# Patient Record
Sex: Male | Born: 1940 | Race: Black or African American | Hispanic: No | Marital: Married | Smoking: Former smoker
Health system: Southern US, Community
[De-identification: ages and names within clinical notes are randomized; demographics above are authoritative.]

## PROBLEM LIST (undated history)

## (undated) DIAGNOSIS — H409 Unspecified glaucoma: Secondary | ICD-10-CM

## (undated) DIAGNOSIS — I517 Cardiomegaly: Secondary | ICD-10-CM

## (undated) DIAGNOSIS — I2510 Atherosclerotic heart disease of native coronary artery without angina pectoris: Secondary | ICD-10-CM

## (undated) DIAGNOSIS — M1990 Unspecified osteoarthritis, unspecified site: Secondary | ICD-10-CM

## (undated) DIAGNOSIS — E785 Hyperlipidemia, unspecified: Secondary | ICD-10-CM

## (undated) DIAGNOSIS — K449 Diaphragmatic hernia without obstruction or gangrene: Secondary | ICD-10-CM

## (undated) DIAGNOSIS — K5792 Diverticulitis of intestine, part unspecified, without perforation or abscess without bleeding: Secondary | ICD-10-CM

## (undated) DIAGNOSIS — E119 Type 2 diabetes mellitus without complications: Secondary | ICD-10-CM

## (undated) DIAGNOSIS — G4730 Sleep apnea, unspecified: Secondary | ICD-10-CM

## (undated) DIAGNOSIS — K219 Gastro-esophageal reflux disease without esophagitis: Secondary | ICD-10-CM

## (undated) DIAGNOSIS — I10 Essential (primary) hypertension: Secondary | ICD-10-CM

## (undated) DIAGNOSIS — N400 Enlarged prostate without lower urinary tract symptoms: Secondary | ICD-10-CM

## (undated) DIAGNOSIS — A0472 Enterocolitis due to Clostridium difficile, not specified as recurrent: Secondary | ICD-10-CM

## (undated) HISTORY — DX: Unspecified osteoarthritis, unspecified site: M19.90

## (undated) HISTORY — DX: Benign prostatic hyperplasia without lower urinary tract symptoms: N40.0

## (undated) HISTORY — DX: Diaphragmatic hernia without obstruction or gangrene: K44.9

## (undated) HISTORY — PX: TRANSURETHRAL RESECTION OF PROSTATE: SHX73

## (undated) HISTORY — DX: Hyperlipidemia, unspecified: E78.5

## (undated) HISTORY — PX: ROTATOR CUFF REPAIR: SHX139

## (undated) HISTORY — DX: Type 2 diabetes mellitus without complications: E11.9

## (undated) HISTORY — DX: Atherosclerotic heart disease of native coronary artery without angina pectoris: I25.10

## (undated) HISTORY — PX: COLONOSCOPY: SHX174

## (undated) HISTORY — DX: Sleep apnea, unspecified: G47.30

## (undated) HISTORY — PX: CAROTID ENDARTERECTOMY: SUR193

## (undated) HISTORY — PX: STOMACH SURGERY: SHX791

## (undated) HISTORY — DX: Diverticulitis of intestine, part unspecified, without perforation or abscess without bleeding: K57.92

## (undated) HISTORY — DX: Unspecified glaucoma: H40.9

## (undated) HISTORY — PX: CARDIAC CATHETERIZATION: SHX172

## (undated) HISTORY — DX: Cardiomegaly: I51.7

## (undated) HISTORY — DX: Gastro-esophageal reflux disease without esophagitis: K21.9

## (undated) HISTORY — DX: Essential (primary) hypertension: I10

---

## 1998-07-14 ENCOUNTER — Emergency Department (HOSPITAL_COMMUNITY): Admission: EM | Admit: 1998-07-14 | Discharge: 1998-07-14 | Payer: Self-pay | Admitting: Emergency Medicine

## 2001-09-29 ENCOUNTER — Encounter: Payer: Self-pay | Admitting: Orthopedic Surgery

## 2001-09-29 ENCOUNTER — Ambulatory Visit (HOSPITAL_COMMUNITY): Admission: RE | Admit: 2001-09-29 | Discharge: 2001-09-29 | Payer: Self-pay | Admitting: Orthopedic Surgery

## 2001-10-13 ENCOUNTER — Ambulatory Visit (HOSPITAL_COMMUNITY): Admission: RE | Admit: 2001-10-13 | Discharge: 2001-10-13 | Payer: Self-pay | Admitting: Cardiology

## 2002-03-15 ENCOUNTER — Ambulatory Visit (HOSPITAL_COMMUNITY): Admission: RE | Admit: 2002-03-15 | Discharge: 2002-03-15 | Payer: Self-pay | Admitting: Family Medicine

## 2005-06-11 ENCOUNTER — Ambulatory Visit (HOSPITAL_COMMUNITY): Admission: RE | Admit: 2005-06-11 | Discharge: 2005-06-11 | Payer: Self-pay | Admitting: *Deleted

## 2005-08-04 ENCOUNTER — Ambulatory Visit (HOSPITAL_COMMUNITY): Admission: RE | Admit: 2005-08-04 | Discharge: 2005-08-04 | Payer: Self-pay | Admitting: Vascular Surgery

## 2005-08-10 ENCOUNTER — Ambulatory Visit (HOSPITAL_COMMUNITY): Admission: RE | Admit: 2005-08-10 | Discharge: 2005-08-11 | Payer: Self-pay | Admitting: Vascular Surgery

## 2005-08-10 ENCOUNTER — Encounter (INDEPENDENT_AMBULATORY_CARE_PROVIDER_SITE_OTHER): Payer: Self-pay | Admitting: *Deleted

## 2005-09-15 ENCOUNTER — Ambulatory Visit (HOSPITAL_COMMUNITY): Admission: RE | Admit: 2005-09-15 | Discharge: 2005-09-15 | Payer: Self-pay | Admitting: Cardiology

## 2007-02-25 ENCOUNTER — Ambulatory Visit: Payer: Self-pay | Admitting: Vascular Surgery

## 2008-09-05 ENCOUNTER — Emergency Department (HOSPITAL_COMMUNITY): Admission: EM | Admit: 2008-09-05 | Discharge: 2008-09-05 | Payer: Self-pay | Admitting: Emergency Medicine

## 2009-09-17 ENCOUNTER — Ambulatory Visit: Payer: Self-pay | Admitting: Gastroenterology

## 2009-09-17 DIAGNOSIS — K219 Gastro-esophageal reflux disease without esophagitis: Secondary | ICD-10-CM | POA: Insufficient documentation

## 2009-09-17 DIAGNOSIS — M129 Arthropathy, unspecified: Secondary | ICD-10-CM | POA: Insufficient documentation

## 2009-09-17 DIAGNOSIS — E119 Type 2 diabetes mellitus without complications: Secondary | ICD-10-CM | POA: Insufficient documentation

## 2009-09-17 DIAGNOSIS — I517 Cardiomegaly: Secondary | ICD-10-CM | POA: Insufficient documentation

## 2009-09-17 DIAGNOSIS — E7800 Pure hypercholesterolemia, unspecified: Secondary | ICD-10-CM | POA: Insufficient documentation

## 2009-09-17 DIAGNOSIS — I6529 Occlusion and stenosis of unspecified carotid artery: Secondary | ICD-10-CM | POA: Insufficient documentation

## 2009-09-17 DIAGNOSIS — R1013 Epigastric pain: Secondary | ICD-10-CM | POA: Insufficient documentation

## 2009-09-17 DIAGNOSIS — I2510 Atherosclerotic heart disease of native coronary artery without angina pectoris: Secondary | ICD-10-CM | POA: Insufficient documentation

## 2009-09-17 DIAGNOSIS — I10 Essential (primary) hypertension: Secondary | ICD-10-CM | POA: Insufficient documentation

## 2009-09-20 ENCOUNTER — Ambulatory Visit (HOSPITAL_COMMUNITY): Admission: RE | Admit: 2009-09-20 | Discharge: 2009-09-20 | Payer: Self-pay | Admitting: Gastroenterology

## 2009-09-30 ENCOUNTER — Ambulatory Visit: Payer: Self-pay | Admitting: Gastroenterology

## 2009-09-30 LAB — CONVERTED CEMR LAB: UREASE: NEGATIVE

## 2009-10-15 ENCOUNTER — Telehealth: Payer: Self-pay | Admitting: Gastroenterology

## 2009-10-28 ENCOUNTER — Telehealth: Payer: Self-pay | Admitting: Gastroenterology

## 2009-12-11 ENCOUNTER — Telehealth: Payer: Self-pay | Admitting: Gastroenterology

## 2010-11-18 NOTE — Miscellaneous (Signed)
Summary: clotest  Clinical Lists Changes  Orders: Added new Test order of TLB-H Pylori Screen Gastric Biopsy (83013-CLOTEST) - Signed 

## 2010-11-18 NOTE — Progress Notes (Signed)
Summary: EGD results wanted  Phone Note Call from Patient Call back at Home Phone [phone] Call back at [phone]   Caller: Patient Call For: Dr. Jarold Motto Reason for Call: Talk to Nurse Summary of Call: pt would like his EGD results Initial call taken by: Vallarie Mare,  October 15, 2009 10:50 AM  Follow-up for Phone Call        Results of EGD discussed with pt.  Pt states Nexium is causing stomach to hurt more.  Omeperazole did the same thing.  Asking to try another reflux med.  Insurance would not cover generic Protonix.  Pt asks if he can be given samples to try before having to pay for Rx that he may not be able to tolerate.   Follow-up by: Ashok Cordia RN,  October 15, 2009 12:19 PM  Additional Follow-up for Phone Call Additional follow up Details #1::        Pt given samples of aciphex to try for a few weeks.  Pt will report back. Additional Follow-up by: Ashok Cordia RN,  October 16, 2009 10:07 AM    New/Updated Medications: ACIPHEX 20 MG  TBEC (RABEPRAZOLE SODIUM) Take 1 each day 30 minutes before meals

## 2010-11-18 NOTE — Letter (Signed)
Summary: EGD Instructions  [facility]  [address] [place], [place] [address]   Phone: [phone]  Fax: [phone]       Frederick Cisneros    June 05, 1941    MRN: [id number]       Procedure Day /Date: Monday, 12/13/10_     Arrival Time: 8;30     Procedure Time:\9:30_     Location of Procedure:                    _x  [name] (4th Floor) _  _ [facility] ( Outpatient Registration) _  _ [facility] (Short Stay on [address]   PREPARATION FOR ENDOSCOPY   On MONDAY, 09/30/09 THE DAY OF THE PROCEDURE:  1.   No solid foods, milk or milk products are allowed after midnight the night before your procedure.  2.   Do not drink anything colored red or purple.  Avoid juices with pulp.  No orange juice.  3.  You may drink clear liquids until 7:30 am, which is 2 hours before your procedure.                                                                                                CLEAR LIQUIDS INCLUDE: Water Jello Ice Popsicles Tea (sugar ok, no milk/cream) Powdered fruit flavored drinks Coffee (sugar ok, no milk/cream) Gatorade Juice: apple, white grape, white cranberry  Lemonade Clear bullion, consomm, broth Carbonated beverages (any kind) Strained chicken noodle soup Hard Candy   MEDICATION INSTRUCTIONS  Unless otherwise instructed, you should take regular prescription medications with a small sip of water as early as possible the morning of your procedure.  Diabetic patients - see separate instructions.                      OTHER INSTRUCTIONS  You will need a responsible adult at least 70 years of age to accompany you and drive you home.   This person must remain in the waiting room during your procedure.  Wear loose fitting clothing that is easily removed.  Leave jewelry and other valuables at home.  However, you may wish to bring a book to read or an iPod/MP3 player to listen to music as you  wait for your procedure to start.  Remove all body piercing jewelry and leave at home.  Total time from sign-in until discharge is approximately 2-3 hours.  You should go home directly after your procedure and rest.  You can resume normal activities the day after your procedure.  The day of your procedure you should not:   Drive   Make legal decisions   Operate machinery   Drink alcohol   Return to work  You will receive specific instructions about eating, activities and medications before you leave.    The above instructions have been reviewed and explained to me by   _______________________    I fully understand and can verbalize these instructions _____________________________ Date _________

## 2010-11-18 NOTE — Progress Notes (Signed)
Summary: Samples worked well  Phone Note Call from Patient Call back at [employer] [phone]   Call For: Dr Jarold Motto Reason for Call: Talk to Nurse Summary of Call: Question about his medications. Got samples and they seem to be working well but thinks his insurance will not cover. Initial call taken by: Leanor Kail [facility],  October 28, 2009 11:23 AM  Follow-up for Phone Call        Aciphex is working well without any side effects.  [facility]  Not sure of insurance coverage for this.  Will send rx and if not covered will try to get prior aurth. Follow-up by: Ashok Cordia RN,  October 28, 2009 11:36 AM    Prescriptions: ACIPHEX 20 MG  TBEC (RABEPRAZOLE SODIUM) Take 1 each day 30 minutes before meals  #30 x 6   Entered by:   Ashok Cordia RN   Authorized by:   Mardella Layman MD [facility]   Signed by:   Ashok Cordia RN on 10/28/2009   Method used:   [facility]  [address] [phone]* (retail)       [address]       [place], [place]  [id number]       Ph: [phone] or [phone]       Fax: [phone]   RxID:   [id number]

## 2010-11-18 NOTE — Assessment & Plan Note (Signed)
Summary: persistent gerd....em,   History of Present Illness Visit Type: Initial Consult Primary GI MD: Sheryn Bison MD FACP FAGA Primary Provider: Holley Bouche, MD Requesting Provider: Holley Bouche, MD Chief Complaint: Patient referred for persistant reflux and midepigastric pain. Patient complains of of a bad taste in his mouth and being woke at 3am by the pain in his mid abdomin. He feels like his reflux has been getting worse in the last two years and his PPI is not working to contol his reflux.  History of Present Illness:   This patient is a 70 year old African American Medicare patient referred by Dr. Tiburcio Pea for history of daily epigastric pain of a burning nature with regurgitation and acid reflux symptoms but no dysphagia. He freely has nocturnal awakening with epigastric pain but does not use antacids because they" make me hallucinate." He's been on Prilosec for the last year but takes daily aspirin and frequent NSAIDs. He allegedly had an endoscopy many years ago but we cannot find this report in the records. He allegedly had a colonoscopy by Dr. Loreta Ave in 2008. He currently denies lower gastrointestinal or hepatobiliary complaints. He denies a specific food intolerances, anorexia weight loss, alcohol or cigarette abuse. Family history noncontributory.  Records from Dr. Tiburcio Pea are reviewed including normal liver function tests. He does suffer from nonobstructive coronary artery disease, peripheral vascular disease, hypertension, and hyperlipidemia and is on multiple medications are also reviewed.   GI Review of Systems    Reports abdominal pain, belching, chest pain, and  heartburn.     Location of  Abdominal pain: epigastric area.    Denies acid reflux, bloating, dysphagia with liquids, dysphagia with solids, loss of appetite, nausea, vomiting, vomiting blood, weight loss, and  weight gain.        Denies anal fissure, black tarry stools, change in bowel habit, constipation,  diarrhea, diverticulosis, fecal incontinence, heme positive stool, hemorrhoids, irritable bowel syndrome, jaundice, light color stool, liver problems, rectal bleeding, and  rectal pain. Preventive Screening-Counseling & Management  Alcohol-Tobacco     Smoking Status: quit      Drug Use:  no.      Current Medications (verified): 1)  Omeprazole 20 Mg  Cpdr (Omeprazole) .Marland Kitchen.. 1 Each Day 30 Minutes Before Meal 2)  Metoprolol Tartrate 100 Mg Tabs (Metoprolol Tartrate) .... Take One By Mouth Once Daily 3)  Amlodipine Besylate 5 Mg Tabs (Amlodipine Besylate) .Marland Kitchen.. 1 1/2 Tablet By Mouth Once Daily 4)  Triamterene-Hctz 37.5-25 Mg Caps (Triamterene-Hctz) .Marland Kitchen.. 1 By Mouth Every Morning 5)  Crestor 20 Mg Tabs (Rosuvastatin Calcium) .... Take One By Mouth Once Daily 6)  Welchol 625 Mg Tabs (Colesevelam Hcl) .... Take One By Mouth Two Times A Day 7)  Ibuprofen 800 Mg Tabs (Ibuprofen) .... Take One By Mouth As Needed 8)  Aspirin 325 Mg Tabs (Aspirin) .Marland Kitchen.. 1 By Mouth Once Daily 9)  Aleve 220 Mg Tabs (Naproxen Sodium) .... Take One By Mouth As Needed 10)  Centrum Silver  Tabs (Multiple Vitamins-Minerals) .... Take One By Mouth Once Daily  Allergies (verified): No Known Drug Allergies  Past History:  Past medical, surgical, family and social histories (including risk factors) reviewed for relevance to current acute and chronic problems.  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 Family History  of Prostate Cancer: maternal uncle No FH of Colon Cancer: Family History of Diabetes: 4 out of 5 sister  Family History of Breast Cancer: sister  Social  History: Reviewed history and no changes required. Occupation: retired married two sons and two daughters Patient is a former smoker.  Alcohol Use - no Daily Caffeine Use 2 per day Illicit Drug Use - no Smoking Status:  quit Drug Use:  no  Review of Systems       The patient complains of arthritis/joint pain, headaches-new, night sweats, and sleeping problems.  The patient denies allergy/sinus, anemia, anxiety-new, back pain, blood in urine, breast changes/lumps, change in vision, confusion, cough, coughing up blood, depression-new, fainting, fatigue, fever, hearing problems, heart murmur, heart rhythm changes, itching, menstrual pain, muscle pains/cramps, nosebleeds, pregnancy symptoms, shortness of breath, skin rash, sore throat, swelling of feet/legs, swollen lymph glands, thirst - excessive , urination - excessive , urination changes/pain, urine leakage, vision changes, and voice change.    Vital Signs:  Patient profile:   70 year old male Height:      65.5 inches Weight:      169.8 pounds BMI:     27.93 Pulse rate:   68 / minute Pulse rhythm:   regular BP sitting:   144 / 68  (left arm) Cuff size:   regular  Vitals Entered By: Harlow Mares CMA (AAMA) (September 17, 2009 8:40 AM)  Physical Exam  General:  Well developed, well nourished, no acute distress.healthy appearing.   Head:  Normocephalic and atraumatic. Eyes:  PERRLA, no icterus.exam deferred to patient's ophthalmologist.   Neck:  Supple; no masses or thyromegaly. Lungs:  Clear throughout to auscultation. Heart:  Regular rate and rhythm; no murmurs, rubs,  or bruits. Abdomen:  Soft, nontender and nondistended. No masses, hepatosplenomegaly or hernias noted. Normal bowel sounds. Msk:  Symmetrical with no gross deformities. Normal posture. Extremities:  No clubbing, cyanosis, edema or deformities noted. Neurologic:  Alert and  oriented x4;  grossly normal neurologically. Cervical Nodes:  No significant cervical  adenopathy. Axillary Nodes:  No significant axillary adenopathy. Inguinal Nodes:  No significant inguinal adenopathy. Psych:  Alert and cooperative. Normal mood and affect.   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 I changed him to Nexium 40 mg before supper and scheduled endoscopic exam. We will try to obtain previous GI records for review from Dr. Loreta Ave. Also schedule ultrasound exam to exclude cholelithiasis.  Problem # 2:  ARTHRITIS (ICD-716.90) Assessment: Unchanged Consider switching to a Cox 2 inhibitor which would cause less gastrointestinal mucosal damage.  Problem # 3:  HYPERTENSION (ICD-401.9) Assessment: Improved blood pressure today is 144/68 and advised to continue all his other medications per Dr. Tiburcio Pea  Patient Instructions: 1)  Copy sent to : Dr. Johny Blamer 2)  Please continue current medications.  3)  Avoid foods high in acid content ( tomatoes, citrus juices, spicy foods) . Avoid eating within 3 to 4 hours of lying down or before exercising. Do not over eat; try smaller more frequent meals. Elevate head of bed four inches when sleeping.  4)  Conscious Sedation brochure given.  5)  Upper Endoscopy brochure given.   Appended Document: persistent gerd....em,    Clinical Lists Changes  Medications: Changed medication from OMEPRAZOLE 20 MG  CPDR (OMEPRAZOLE) 1 each day 30 minutes before meal to NEXIUM 40 MG CPDR (  ESOMEPRAZOLE MAGNESIUM) 1 by mouth before supper - Signed Rx of NEXIUM 40 MG CPDR (ESOMEPRAZOLE MAGNESIUM) 1 by mouth before supper;  #30 x 6;  Signed;  Entered by: Ashok Cordia RN;  Authorized by: Mardella Layman MD [facility];  [facility] [phone]*, [address], [place], [place], [place]  [id number], Ph:  [phone] or [phone], Fax: [phone] Orders: Added new Test order of EGD (EGD) - Signed Added new Test order of Ultrasound Abdomen (UAS) - Signed    Prescriptions: NEXIUM 40 MG CPDR (ESOMEPRAZOLE MAGNESIUM) 1 by mouth before supper  #30 x 6   Entered by:   Ashok Cordia RN   Authorized by:   Mardella Layman MD [facility]   Signed by:   Ashok Cordia RN on 09/17/2009   Method used:   [facility]        [facility]  [address] [phone]* (retail)       [address]       [place], [place]  [id number]       Ph: [phone] or [phone]       Fax: [phone]   RxID:   [id number]

## 2010-11-18 NOTE — Progress Notes (Signed)
Summary: med change  Phone Note Call from Patient Call back at Home Phone [phone]   Caller: Patient Call For: Dr. Jarold Motto Reason for Call: Talk to Nurse Summary of Call: pt would like to be rx'ed something cheaper than Aciphex... pt suggested generic [facility]ix... [facility] [address] [facility] [place] Initial call taken by: Vallarie Mare,  December 11, 2009 9:33 AM  Follow-up for Phone Call        OK to try generic protonix.  Rx sent.  Pt notified.  Follow-up by: Ashok Cordia RN,  December 11, 2009 9:46 AM    New/Updated Medications: PANTOPRAZOLE SODIUM 40 MG TBEC (PANTOPRAZOLE SODIUM) 1 by mouth qd Prescriptions: PANTOPRAZOLE SODIUM 40 MG TBEC (PANTOPRAZOLE SODIUM) 1 by mouth qd  #30 x 11   Entered by:   Ashok Cordia RN   Authorized by:   Mardella Layman MD [facility]   Signed by:   Ashok Cordia RN on 12/11/2009   Method used:   [facility]        [facility]  [address] [phone]* (retail)       [address]       [place], [place]  [id number]       Ph: [phone] or [phone]       Fax: [phone]   RxID:   [id number]

## 2010-11-18 NOTE — Procedures (Signed)
Summary: Upper Endoscopy  Patient: Frederick Cisneros Note: All result statuses are Final unless otherwise noted.  Tests: (1) Upper Endoscopy (EGD)   EGD Upper Endoscopy       DONE     [facility]     [address]     [place], [place]  [address]           ENDOSCOPY PROCEDURE REPORT           PATIENT:  [name], [name]  MR#:  [id number]     BIRTHDATE:  12-26-1940, 68 yrs. old  GENDER:  male           ENDOSCOPIST:  Vania Rea. Jarold Motto, MD, [facility]     Referred by:           PROCEDURE DATE:  09/30/2009     PROCEDURE:  EGD with biopsy     ASA CLASS:  Class II     INDICATIONS:  abdominal pain despite treatment           MEDICATIONS:   Fentanyl 25 mcg IV, Versed 3 mg IV     TOPICAL ANESTHETIC:  Exactacain Spray           DESCRIPTION OF PROCEDURE:   After the risks benefits and     alternatives of the procedure were thoroughly explained, informed     consent was obtained.  The LB [id number]-[id number] endoscope was     introduced through the mouth and advanced to the second portion of     the duodenum, without limitations.  The instrument was slowly     withdrawn as the mucosa was fully examined.     <<PROCEDUREIMAGES>>           A hiatal hernia was found. SMALL 2 CM.HH NOTED  Normal duodenal     folds were noted.  The esophagus and gastroesophageal junction     were completely normal in appearance.  The stomach was entered and     closely examined. The antrum, angularis, and lesser curvature were     well visualized, including a retroflexed view of the cardia and     fundus. The stomach wall was normally distensable. The scope     passed easily through the pylorus into the duodenum. CLO BX. DONE.     Retroflexed views revealed no abnormalities.    The scope was then     withdrawn from the patient and the procedure completed.           COMPLICATIONS:  None           ENDOSCOPIC IMPRESSION:     1) Hiatal hernia     2) Normal duodenal folds     3) Normal esophagus     4) Normal  stomach     CHRONIC GERD     RECOMMENDATIONS:     1) anti-reflux regimen to be follow     2) continue PPI     3) Rx CLO if positive           REPEAT EXAM:  No           ______________________________     Vania Rea. Jarold Motto, MD, [facility] Graham           CC:  Johny Blamer MD           n.     Rosalie Doctor:   Vania Rea. Patterson at 09/30/2009 09:46 AM           Audry Riles, [id number]  Note: An exclamation mark (!) indicates a result that was not dispersed into the flowsheet. Document Creation Date: 09/30/2009 9:46 AM _______________________________________________________________________  (1) Order result status: Final Collection or observation date-time: 09/30/2009 09:40 Requested date-time:  Receipt date-time:  Reported date-time:  Referring Physician:   Ordering Physician: Sheryn Bison [phone]) Specimen Source:  Source: Launa Grill Order Number: [id number] Lab site:

## 2011-03-03 NOTE — Consult Note (Signed)
NAME:  Frederick Cisneros, Frederick Cisneros NO.:  [id number]   MEDICAL RECORD NO.:  [id number]          PATIENT TYPE:  EMS   LOCATION:  MAJO                         FACILITY:  [facility]   PHYSICIAN:  Jake Bathe, MD      DATE OF BIRTH:  1941-05-10   DATE OF CONSULTATION:  DATE OF DISCHARGE:  09/05/2008                                 CONSULTATION   REFERRING PHYSICIAN:  Rhae Lerner. Margretta Ditty, MD, [facility] Emergency  [facility].   CARDIOLOGIST:  Armanda Magic, MD   PRIMARY CARE PHYSICIAN:  Holley Bouche, MD   REASON FOR CONSULTATION:  Frederick Cisneros is being seen at the request of Dr.  Margretta Ditty for the evaluation of chest discomfort and abnormal EKG.   HISTORY OF PRESENT ILLNESS:  Frederick Cisneros is a 70 year old patient with a 1-  week history of reflux  says that he has a burning from his stomach up  to his throat into his mouth and always occurs at night.  This has been  intensifying over the past few days.  Sometimes he does have a funny  taste in his mouth.   He says that Zantac is not helping him.  He did  have a visit over at the [facility] where a GI  cocktail was given and it did help him.  He still had some subtle  burning and was given a sublingual nitroglycerin and morphine, which  completely relieved his symptoms.  We were called to evaluate not only  for his chest discomfort but also his markedly abnormal EKG.  Dr. Mayford Knife  in 2002 had done a preoperative workup on him, which involved a cardiac  catheterization.  The cardiac catheterization at that time showed  nonobstructive coronary artery disease with 40% narrowing of the LAD  distal to the first diagonal branch.  Otherwise, all of the other  arteries were widely patent.  He did have an aortic pressure of 175/89  as well as an LV pressure of 173/11 and was noted to have moderate left  ventricular hypertrophy, which most likely accounted for his markedly  abnormal T-wave inversions on EKG.  Currently, he is  comfortable in his  bed, not complaining of any symptoms with his wife at bedside.   PAST MEDICAL HISTORY:  1. Nonobstructive CAD as described above.  2. Status post left shoulder surgery.  3. Left carotid endarterectomy in October 2006.  4. Hypercholesterolemia.  5. Hypertension.  6. Moderate left ventricular hypertrophy.  7. GERD.   ALLERGIES:  No known drug allergies.   MEDICATIONS:  1. Norvasc 10 mg once a day.  2. Aspirin 325 mg once a day.  3. Zantac at home.  4. Toprol-XL 25 mg a day.  5. Triamterene and Crestor, unknown dosages.   SOCIAL HISTORY:  Lives in [place] with his wife.  Denies any tobacco,  alcohol, or drug use.   FAMILY HISTORY:  His father died of lung cancer.  His mother died at age  74 of old age.  He has 4 sisters, all of which have diabetes.  No early  family history  of coronary artery disease.   REVIEW OF SYSTEMS:  He complains of epigastric/chest pain/acidic taste  in his mouth, abdominal discomfort.  He denies any syncope,  palpitations, fevers, chills, vomiting, hematochezia.  Unless stated  above, all other 12 review of systems negative.   PHYSICAL EXAMINATION:  VITAL SIGNS:  Temperature 98.8; pulse 74;  respirations 14; blood pressure 165/70 on arrival, now 126/60; sating  98% on room air.  GENERAL:  Alert and oriented x3 in no acute distress, resting  comfortably in bed with his wife at bedside.  HEENT:  Normocephalic, atraumatic.  Sclerae are clear.  Moist mucous  membranes noted.  NECK:  Supple.  No lymphadenopathy, no thyromegaly.  No bruits  appreciated.  No JVD.  Lymphadenopathy is not present.  CARDIOVASCULAR:  Regular rate and rhythm with soft systolic murmur heard  at left upper sternal border, otherwise, normal.  Hyperdynamic PMI  noted.  LUNGS:  Clear to auscultation bilaterally.  No wheezes, no rales.  SKIN:  Warm, dry, and intact.  No rashes.  GI:  Soft, nontender, normoactive bowel sounds.  No hepatosplenomegaly.  I am not  able to reproduce any epigastric tenderness.  EXTREMITIES:  No clubbing, cyanosis, or edema.  Normal distal pulses.  MUSCULOSKELETAL:  No joint deformities noted.  NEUROLOGIC:  Nonfocal.  I did not assess gait.  PSYCH:  Normal mood.   LABORATORY DATA:  White count 4.7, hemoglobin 15.6, hematocrit 46,  platelets 179.  Sodium 139, potassium 3.9, BUN 11, creatinine 1.3,  glucose 135.  CK-MB and troponin 155/3.0/0.01, all within normal limits.  H. pylori point-of-care screen was negative.  Lipase was 27.  Hepatic  function panel were all within normal limits.  EKG from today shows  sinus rhythm with marked T-wave inversion noted in V3, V4, V5, V6, as  well as leads II, III, and aVF.  No associated ST elevation noted.  When  compared to prior ECG from October 2006, the only significant change is  that his T-waves are now inverted in lead III and more pronounced in  aVF.  Otherwise, he does have a hypertrophic appearance.  His QTc was  464 milliseconds, slightly prolonged.  A chest x-ray was not performed  today.  A chest x-ray from October 2006 showed no active cardiopulmonary  disease, this was personally viewed.  Prior medical records reviewed.   ASSESSMENT AND PLAN:  This is a 70 year old male with minor  nonobstructive coronary disease seen on catheterization in 2002 of 40%  mid left anterior descending lesion with gastroesophageal reflux  disease, hypercholesterolemia, hypertension, moderate left ventricular  hypertrophy with epigastric/chest discomfort/atypical chest pain.  1. Atypical chest pain - His symptoms have been present now for over 3-      4 days and were relieved mostly with GI cocktail.  He stated that      Zantac is no longer working for him.  His EKG is essentially      unchanged from his prior ECG in 2006.  He also had a cardiac      catheterization as described above with the same ECG, which showed      nonobstructive disease.  I do, however, feel as though given his       symptoms although atypical, he warrants a stress test evaluation      and we will perform this on September 11, 2008, at 9:30 a.m. at      [facility].  In the meantime, I will treat him for worsening  GERD with Prilosec 20 mg once a day.  I have also given a      prescription for nitroglycerin as needed.  In addition to the      stress test, I will also order an echocardiogram to be done on that      same day to monitor his left ventricular.  His EKG is consistent      with a hypertrophic appearance.  His cardiac biomarkers are      reassuring, and after a long discussion with, both he and his wife,      we all feel comfortable with him going home since his chest pain      had been present for several days.  Of course if things worsen, he      knows to contact me immediately or call 911.  2. Hypertension - Continue current regimen, which consists of Norvasc,      triamterene, and Toprol.  3. Hyperlipidemia - Continue Crestor.  4. Peripheral vascular disease - Status post left carotid      endarterectomy.  No stroke-like symptoms presently.      Jake Bathe, MD  Electronically Signed     MCS/MEDQ  D:  09/05/2008  T:  09/06/2008  Job:  [id number]   cc:   Armanda Magic, M.D.  Rhae Lerner. Margretta Ditty, M.D.

## 2011-03-06 NOTE — Op Note (Signed)
NAME:  Frederick Cisneros, Frederick Cisneros              ACCOUNT NO.:  [id number]   MEDICAL RECORD NO.:  [id number]          PATIENT TYPE:  OIB   LOCATION:  3311                         FACILITY:  [facility]   PHYSICIAN:  Janetta Hora. Fields, MD  DATE OF BIRTH:  February 12, 1941   DATE OF PROCEDURE:  08/10/2005  DATE OF DISCHARGE:                                 OPERATIVE REPORT   PROCEDURE:  Left carotid endarterectomy.   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   ANESTHESIA:  General.   ASSISTANT:  Jerold Coombe, P.A.   INDICATIONS:  The patient is a 70 year old male with history of a high-grade  left internal carotid artery stenosis.  This was confirmed to be greater  than 95% by carotid angiography.   OPERATIVE FINDINGS:  1.  Greater than 95% left internal carotid artery stenosis.  2.  Dacron patch.   OPERATIVE DETAILS:  After obtaining informed consent, the patient was taken  to the operating room.  The patient was placed in supine position on the  operating table.  After induction of general anesthesia, a Foley catheter  was placed.  Next, the patient's entire left neck and chest were prepped and  draped in the usual sterile fashion.  An oblique incision was made anterior  to the border of the left sternocleidomastoid muscle.  Incision was carried  down through the subcutaneous tissues down to the platysma.  The dissection  was carried along the anterior border of the sternocleidomastoid muscle.  This reflected laterally.  Dissection was then carried along the anterior  border of the internal jugular vein and this was also reflected laterally.  The common facial vein was dissected free circumferentially and ligated and  divided with silk ties.  The common carotid artery was then dissected free  in the medial portion of the incision.  The vagus nerve was  identified and  protected from harm's way.  Rumel tourniquet was placed around the common  carotid artery.  Next, the superior thyroid and external carotid arteries  were dissected free circumferentially and controlled with Vesseloops.  The  distal internal carotid artery was then dissected free circumferentially  after administration of 7000 units of intravenous heparin.  The internal  carotid artery was small, approximately 2.5 to 3 mm in diameter.  It was  soft in character.  Next, the distal internal carotid artery was clamped  with fine bulldog clamp.  The Vesseloops were pulled taut on the external  carotid artery and the common carotid was controlled with a peripheral  DeBakey clamp.  A longitudinal arteriotomy was made just below the level  carotid bifurcation.  Endarterectomy was then begun in a suitable plane.  Eversion endarterectomy was performed in the extra-carotid and superior  thyroid arteries. A suitable endpoint was obtained in the distal internal  carotid artery.  The stenosis was greater than 95%.  Plaque was removed and  sent to Pathology as a specimen.  Next, the distal internal carotid artery  was tacked posteriorly with several interrupted  7-0 Prolene sutures.  The  proximal common carotid artery was also tacked with several interrupted 7-0  Prolene sutures.  An attempt was made to place an 8-French shunt distally.  However, the internal carotid artery was too small and would not accept this  easily.  Therefore, the patch was placed with no shunt.  A Dacron patch was  then sewn on as a patch angioplasty using running 6-0 Prolene suture.  Just  prior to completion of the anastomosis, this was fore-bled, back-bled and  thoroughly flushed.  Anastomosis was secured after everything had been  thoroughly back-bled and flushed.  Flow was then restored first to the  external carotid artery and after approximately five cardiac cycles, to the  internal carotid artery after  everything was unclamped.  Several repair  sutures were placed.  Hemostasis was obtained with 30 mg of protamine and  thrombin and Gelfoam.  A 10 flat Jackson-Pratt drain was placed in the  subcutaneous tissues over the patch.  Platysma was reapproximated using a  running 3-0 Vicryl suture.  The skin was then closed with a 4-0 Vicryl  subcuticular stitch.  The patient tolerated procedure well and there were no  immediate complications.  The Jackson-Pratt drain was brought through a  separate stab incision through the inferior portion of the neck.  Instrument, sponge and needle count was correct at the end of the case.  The  patient was awakened in the operating room and moving upper extremities and  lower extremities symmetrically.  The patient was taken to recovery room in  stable condition.           ______________________________  Janetta Hora Fields, MD     CEF/MEDQ  D:  08/10/2005  T:  08/11/2005  Job:  [id number]

## 2011-03-06 NOTE — H&P (Signed)
NAME:  Frederick Cisneros, Frederick Cisneros              ACCOUNT NO.:  [id number]   MEDICAL RECORD NO.:  [id number]          PATIENT TYPE:  AMB   LOCATION:  SDS                          FACILITY:  [facility]   PHYSICIAN:  Janetta Hora. Fields, MD  DATE OF BIRTH:  1941/05/10   DATE OF ADMISSION:  08/04/2005  DATE OF DISCHARGE:                                HISTORY & PHYSICAL   CHIEF COMPLAINT:  Left internal carotid artery stenosis.   HISTORY OF PRESENT ILLNESS:  This is a 70 year old African-American male  with known asymptomatic left carotid artery stenosis.  He was previously  seen in 2003 when this was diagnosed and at that time refused surgery.  The  patient denies any history of CVA or TIA.  He denies headache, nausea,  vomiting, vertigo, dizziness, fall, seizures, numbness, tingling, muscle  weakness, dysarthria, dysphagia, visual changes, and syncope.  He had a  carotid duplex performed on June 11, 2005, which revealed greater than 80%  left internal carotid artery stenosis and 40 to 60% right internal carotid  artery stenosis.  The patient as scheduled and underwent carotid angiogram  on August 04, 2005, and this revealed greater than 95% left internal  carotid artery stenosis.  The patient has agreed to proceed with left  carotid endarterectomy.   PAST MEDICAL HISTORY:  1.  Hypertension.  2.  Hyperlipidemia.  3.  Extracranial cerebrovascular occlusive disease.  4.  GERD.  5.  Coronary artery disease.  6.  Erectile dysfunction.  7.  Peyronie disease.   PAST SURGICAL HISTORY:  Right shoulder surgery.   ALLERGIES:  No known drug allergies.   MEDICATIONS:  1.  Norvasc 5 mg daily.  2.  Toprol XL 100 mg daily.  3.  Vitorin 10 for 40 mg daily.  4.  Aspirin 325 mg daily.  5.  Prilosec 20 mg over-the-counter.  6.  Multivitamins daily.  7.  Maxzide 37.5 per 25 mg daily.   REVIEW OF SYSTEMS:  Please see HPI for significant positives and negatives.  Otherwise negative for diabetes mellitus and  kidney disease.   SOCIAL HISTORY:  Married, four children.  He lives with family.  He quit  smoking in 1991 and rarely uses alcohol.  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   FAMILY HISTORY:  Noncontributory.   PHYSICAL EXAMINATION:  VITAL SIGNS:  Blood pressure 139/76, heart rate 57,  respirations 16.  GENERAL:  This is a 70 year old African-American male in no acute distress.  HEENT:  Normocephalic and atraumatic.  Pupils equal, round, and reactive to  light and accommodations.  Extraocular movements are intact.  Oral mucosa is  pink and moist.  Sclerae nonicteric.  Corneal __________ present  bilaterally.  NECK: Supple with no JVD, bruits, or lymphadenopathy.  Carotids are  palpable.  LUNGS:  Respirations are symmetric, unlabored, and clear.  CARDIAC:  Regular rate and rhythm with no murmurs, rubs, or gallops.  ABDOMEN:  Soft, nontender, nondistended with normoactive bowel sounds.  GU/RECTAL:  Deferred.  EXTREMITIES:  There is no edema, varicosities, or venous stasis changes  noted. Temperature is warm.  PULSES: Radial 2+ bilaterally. Femoral 2+ on the left, not palpable on the  right secondary to angiogram site.  Popliteal 2+ bilaterally.  Dorsalis  pedis 2+ bilaterally.  NEUROLOGIC:  Exam nonfocal.  The patient is alert and oriented x3.  Gait is  not observed.  Muscle strength 5/5 throughout all extremities and symmetric.   ASSESSMENT:  Left internal carotid artery stenosis.   PLAN:  Left carotid endarterectomy on August 10, 2005, by Dr. Fabienne Bruns.  Dr. Darrick Penna has seen and evaluated the patient prior to this  admission and has explained the risks and benefits of the procedure, and the  patient has agreed to continue.      Pecola Leisure, PA    ______________________________  Janetta Hora Fields, MD    AY/MEDQ  D:  08/04/2005  T:  08/04/2005  Job:  [id number]

## 2011-03-06 NOTE — Op Note (Signed)
NAME:  Frederick Cisneros, Frederick Cisneros              ACCOUNT NO.:  [id number]   MEDICAL RECORD NO.:  [id number]          PATIENT TYPE:  AMB   LOCATION:  SDS                          FACILITY:  [facility]   PHYSICIAN:  Janetta Hora. Fields, MD  DATE OF BIRTH:  08-Dec-1940   DATE OF PROCEDURE:  08/04/2005  DATE OF DISCHARGE:                                 OPERATIVE REPORT   PROCEDURE:  Arch and carotid angiogram.   PREOPERATIVE DIAGNOSIS:  Asymptomatic left internal carotid artery stenosis.   POSTOPERATIVE DIAGNOSIS:  Asymptomatic left internal carotid artery  stenosis.   ANESTHESIA:  Local.   SURGEON:  Charles E. Fields, MD   ASSISTANT:  Nurse.   INDICATIONS:  The patient is a 70 year old male with history of an  asymptomatic left internal carotid artery stenosis.  Arteriography is  performed to ascertain whether or note there is a suitable distal endpoint  to do a carotid endarterectomy.   OPERATIVE DETAILS:  After obtaining informed consent, the patient was  brought the PV lab.  Both groins were prepped and draped the usual sterile  fashion.  Local anesthesia was infiltrated over the right common femoral  artery.  A Majestic needle was used to cannulate the right common femoral  artery and 0.035 Wholey wire advanced into the abdominal aorta under  fluoroscopic guidance.  Next, a 5-French sheath was placed over the  guidewire into the right common femoral artery.  A 5-French pigtail catheter  was then placed over the guidewire and these were advanced as a unit up into  the aortic arch.  Arch aortogram was then obtained which shows aberrant arch  anatomy.  There is a common trunk for the left and right common carotid  arteries.  The left subclavian artery is the next branch off the arch.  The  left paratubal artery comes off in normal position off the subclavian artery  and is widely patent.  The right subclavian artery is then the last great  vessel off of the arch.  The right vertebral artery comes  off of the right  subclavian artery and is widely patent at its origin.   Next, the pigtail catheter was pulled back over a guidewire and a JR4  catheter was advanced over the guidewire to selectively catheterize  initially the common trunk, common carotid followed by the right common  carotid artery and selective arteriogram was performed of the right common  carotid artery with intracranial views.  Intracranial views will be  interpreted later by the neuroradiologist.  Right common carotid artery is  widely patent.  This was done in AP and lateral projection.  There is  minimal stenosis at the carotid bifurcation.   Next, the JB4 catheter was then pulled back over the guidewire and an  attempt was made to cannulate the left common carotid artery; however, the  catheter would not engage.  Therefore, this was pulled back over the wire  and a Simmons 1 catheter was placed over the guidewire and reformed in the  arch.  Then using the [facility] 1 catheter, the left common carotid artery was  selectively cannulated.  Again, projections were done in an AP lateral and  oblique view and intracranial views were also obtained in an AP and lateral  projection.  The intracranial views will be interpreted by a  neuroradiologist.  The left common carotid artery is widely patent.  There  is a high-grade stenosis just after the origin of the left internal carotid  artery.  The distal internal carotid artery after the stenosis fills and is  of more normal caliber.  The stenosis is greater than 95%.  The origin of  the external carotid artery is widely patent.   The carotid bifurcation occurs at the level of C4.   Next, the [facility] 1 catheter was pulled back over a guidewire and the  guidewire removed.  The 5-French sheath was then left in place to be removed  in the holding area.  Hemostasis was obtained with direct pressure after  removing the sheath.   The patient tolerated the procedure well and  there were immediate  complications.   OPERATIVE FINDINGS:  1.  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  2.  Aberrant left subclavian artery.  3.  High-grade stenosis of left internal carotid artery greater than 95%.           ______________________________  Janetta Hora Fields, MD     CEF/MEDQ  D:  08/04/2005  T:  08/04/2005  Job:  [id number]

## 2011-03-06 NOTE — Discharge Summary (Signed)
NAME:  Frederick Cisneros, Frederick Cisneros              ACCOUNT NO.:  [id number]   MEDICAL RECORD NO.:  [id number]          PATIENT TYPE:  INP   LOCATION:  3311                         FACILITY:  [facility]   PHYSICIAN:  Janetta Hora. 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   DISCHARGE/SECONDARY DIAGNOSES:  1.  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  2.  Postoperative hypotension.  3.  History of hypertension.  4.  Hyperlipidemia .  5.  Gastroesophageal reflux disease.  6.  Coronary artery disease.  7.  Erectile dysfunction.  8.  Peyronie's disease.  9.  History of right shoulder surgery.  10. No known drug allergies.   PROCEDURE:  On August 10, 2005, left carotid endarterectomy with Dacron  patch angioplasty by Dr. Fabienne Bruns.   HISTORY:  Frederick Cisneros is a 70 year old African American male with known  asymptomatic left carotid artery stenosis. He was previously seen in 2003  when this was diagnosed by Dr. Elyn Peers and at that time refused surgery. He  denied any history of CVA or TIA. He denied headache, nausea, vomiting,  vertigo, dizziness, falls, seizures, numbness, tingling, muscle weakness,  dysarthria, dysphagia, visual changes, or syncope. He had a carotid duplex  performed on June 11, 2005, which revealed greater than 80% left internal  carotid artery stenosis and 40% to 60% right internal carotid artery  stenosis. He underwent carotid angiogram on August 04, 2005, which revealed  greater than 95% left internal carotid artery stenosis. At that time Dr.  Darrick Penna again recommended that he undergo left carotid endarterectomy to  reduce his risks for seizure or stroke. After discussion of risks, benefits,  and alternatives, the  patient did agree to proceed.   HOSPITAL COURSE:  On August 10, 2005, Frederick Cisneros was electively admitted to  [facility] and did undergo left carotid endarterectomy. A Al Pimple drain was placed intraoperatively. There were no known complications  and after a short stay in the recovery unit was transferred to stepdown unit  3300. On postoperative day one, Frederick Cisneros remained hemodynamically stable,  although still requiring dopamine drip for low blood pressure. He was  neurologically intact. He was voiding. During morning rounds he did report  some mild nausea and vomiting overnight which was improving. He denied any  dysphagia. On exam his heart had a regular rate and rhythm with telemetry  showing sinus rhythm at 61. His lung sounds were clear, but diminished at  the bases. His abdominal exam was soft, nontender with hypoactive bowel  sounds and minimal distention. He was neurologically intact with facial  symmetry. His tongue was relatively midline with only the slightest  deviation to the left. He moved all extremities times four and they were  strong and symmetrical. His incision showed no hematoma. His Jackson-Pratt  drainage had less than 30 mL over the last  eight hours and this was  subsequently removed. By late morning he was successfully weaned from his  dopamine drip to keep his systolic greater than 100. He was also weaned from  supplemental oxygen with saturation above 90%. He was able to tolerate oral  intake without further episodes of emesis. He was able to ambulate first  with assist, but later independently. He denied dizziness, although he  initially had an episode of lightheadedness which seemed positional with  initial standing. By mid afternoon he had been off dopamine for over four  hours. His systolic blood pressure was ranging around 99 to 119 with  exception of two readings of 79 and 82 which had occurred within 30 minutes  of discontinuing his  dopamine. His heart remained in the 60s and 70s and in  sinus rhythm. Subsequently it was felt that he was appropriate to be  discharged home. Of note his antihypertensives have been held secondary to  his hypotension postoperatively. The patient did report that he had a wrist  blood pressure monitor at his home and could monitor his blood pressure if  needed. Frederick Cisneros was discharged home on postoperative day one on October 70,  2006.   LABORATORY DATA:  Postoperative labs show a white blood cell count of 9.4,  hemoglobin 12.7, hematocrit 37.0, platelet count 188,000, sodium 135,  potassium 4.1, blood glucose 139, BUN 12, creatinine 1.3, calcium 8.5.   DISCHARGE MEDICATIONS:  1.  Norvasc 7.5 mg p.o. daily.  2.  Aspirin 325 mg p.o. daily.  3.  Toprol 100 mg p.o. daily.  4.  Reglan 10 mg p.o. q.a.c. and q.h.s.  5.  Vytorin 10/40 mg p.o. daily.  6.  Triamterene/hydrochlorothiazide 37.5/25 mg p.o. daily.  7.  Ibuprofen 800 mg p.o. t.i.d. p.r.n.  8.  Tylox one to two tablets p.o. q.4h. p.r.n. pain.   Because Frederick Cisneros had mild hypotension after surgery he was instructed to  take his blood pressure daily for the next three days. If his systolic blood  pressure was less than 100 then he was instructed to hold his Norvasc,  toprol, and his triamterene/hydrochlorothiazide for that day. He was  instructed to let Dr. Mayford Knife, the prescribing physician, know if he had to  hold his medication for more than three days so she could adjust his  medication if it felt warranted. He is also to let Dr. Norris Cross office know  if his systolic blood pressure is ranging less than 88.   DISCHARGE INSTRUCTIONS:  He is to follow a low-fat, low-salt diet. He is to  avoid heavy lifting or driving for the next two weeks and increase his  activity slowly, and was instructed to avoid some movements with progression from lying, sitting, and standing. He may shower starting August 12, 2005.  He should call the CVTS  office if he develops fever greater than 101 or  redness, drainage, or swelling around his incision site. He is to follow up  with Dr. Darrick Penna at the CVTS office on August 28, 2005, at 10:30 a.m.  He  should call sooner if needed.      Jerold Coombe, P.A.    ______________________________  Janetta Hora Fields, MD   AWZ/MEDQ  D:  08/11/2005  T:  08/11/2005  Job:  [id number]   cc:   Armanda Magic, M.D.  Fax: [phone]   Royetta Crochet, MD  Fax: [phone]

## 2011-03-06 NOTE — Cardiovascular Report (Signed)
[place]  Patient:    Frederick Cisneros, Frederick Cisneros Visit Number: [id number] MRN: [id number]          Service Type: CAT Location: [facility] 2899 10 Attending Physician:  Armanda Magic Dictated by:   Armanda Magic, M.D. Proc. Date: 10/13/01 Admit Date:  10/13/2001   CC:         Meredith Staggers, M.D.   Cardiac Catheterization  REFERRING PHYSICIAN:  Meredith Staggers, M.D.  PROCEDURE:  Left heart catheterization, coronary angiography, left ventriculography.  OPERATOR:  Armanda Magic, M.D.  INDICATIONS:  Abnormal EKG.  COMPLICATIONS:  None.  IV MEDICATIONS:  None.  HISTORY:  This is a 70 year old black male with no previous cardiac history but with a significant history of hypertension and hyperlipidemia who was initially scheduled to undergo surgery for rotator cuff repair when the anesthesiologist found a markedly abnormal EKG.  His EKG showed normal sinus rhythm with deep T wave inversions in V3 through V6 as well as the inferior leads.  He now presents for cardiac catheterization.  DESCRIPTION OF PROCEDURE:  The patient was brought to the cardiac catheterization laboratory in the fasting nonsedated state.  Informed consent was obtained.  The patient was connected to continuous heart rate and pulse oximetry monitoring and intermittent blood pressure monitoring.  The right groin was prepped and draped in a sterile fashion.  Xylocaine, 1%, was used for local anesthesia.  Using the modified Seldinger technique, a 6-French sheath was placed in the right femoral artery.  Under fluoroscopic guidance, a 6-French JL4 catheter was placed in the left coronary artery.  Multiple cine films were taken in the 30-degree RAO and 40-degree LAO views.  This catheter was then exchanged over a guidewire for a 6-French JR4 catheter which was placed in the right coronary artery.  Multiple cine films were taken in the 30-degree RAO, 40-degree LAO views.  This catheter  was then exchanged out over a guidewire for a 6-French angled pigtail catheter which was placed in the left ventricular cavity.  Left ventriculography was performed in 30-degree RAO views using a total of 30 cc of contrast at 13 cc per second.  At the end of the procedures, all catheters and sheaths were removed.  Manual compression was performed until adequate hemostasis was obtained.  The patient was transferred back to the room in stable condition.  RESULTS: 1. The left main coronary artery is widely patent and bifurcates into a left    anterior descending artery and left circumflex artery. 2. The left anterior descending artery gives rise to a first diagonal branch    which is widely patent.  Just distal to that, there is a 40% narrowing of    the LAD and then the LAD is widely patent throughout the rest of its course    to the apex. 3. The left circumflex gives rise to two small obtuse marginal branches and    then one large distal obtuse marginal branch which bifurcates into two    daughter branches and is widely patent. 4. The right coronary artery is widely patent throughout the course and    bifurcates distally into a posterior descending artery and posterolateral    artery, both of which are widely patent.  Left ventriculography performed in the 30-degree RAO view using a total of 30 cc of contrast at 13 cc per second showed normal LV function.  No regional wall motion abnormalities.  There is moderate left ventricular hypertrophy. LV pressure 173/11 mmHg.  Aortic  pressure 175/89 mmHg.  ASSESSMENT: 1. Nonobstructive coronary disease. 2. Normal left ventricular function with moderate left ventricular    hypertrophy which may account for the patients T wave inversions on EKG.  PLAN: 1. Discharge to home after bed rest and IV fluids. 2. Follow up with Dr. Mayford Knife in two weeks. 3. Aggressive blood pressure control.  We will start him on Norvasc 5 mg a day    for better blood  pressure control. Dictated by:   Armanda Magic, M.D. Attending Physician:  Armanda Magic DD:  10/13/01 TD:  10/13/01 Job: [id number]

## 2011-03-06 NOTE — Consult Note (Signed)
NAME:  ZAKAREE, MCCLENAHAN NO.:  [id number]   MEDICAL RECORD NO.:  [id number]          PATIENT TYPE:  AMB   LOCATION:  SDS                          FACILITY:  [facility]   PHYSICIAN:  Lacy Duverney, M.D.     DATE OF BIRTH:  08-13-1941   DATE OF CONSULTATION:  08/04/2005  DATE OF DISCHARGE:                                   CONSULTATION   REASON FOR CONSULTATION:  Requested by Dr. Darrick Penna for interpretation of  intracranial views obtained during carotid arteriogram August 04, 2005.   A 70 year old patient with asymptomatic left carotid artery stenosis.   RIGHT COMMON CAROTID ARTERIOGRAM INTRACRANIAL VIEWS (AP AND LATERAL).:  Moderate stenosis precavernous and cavernous segment of the right internal  carotid artery.  Branch vessel atherosclerotic type changes.  Cross filling  left middle cerebral artery and left anterior cerebral artery.   LEFT COMMON CAROTID ARTERIOGRAM INTRACRANIAL VIEWS (AP AND LATERAL).:  With  injection of the left common carotid artery, there is preferential filling  of the left external carotid artery.  This is consistent with significant  proximal stenosis of the left internal carotid artery.   IMPRESSION:  1.  Preferential filling of the left external carotid artery branches with      injection left common carotid artery consistent with significant      stenosis of the left internal carotid artery.  When the right common      carotid artery is injected, there is cross filling of left middle      cerebral artery and anterior cerebral artery vessels also consistent      with significant left internal carotid artery stenosis.  2.  Moderate stenosis and irregularity precavernous and cavernous segment of      right internal carotid artery.  Branch vessel irregularity. These      findings are consistent with atherosclerotic type changes.           ______________________________  Lacy Duverney, M.D.     SO/MEDQ  D:  08/04/2005  T:  08/04/2005  Job:   [id number]

## 2011-06-26 ENCOUNTER — Ambulatory Visit (HOSPITAL_BASED_OUTPATIENT_CLINIC_OR_DEPARTMENT_OTHER): Admission: RE | Admit: 2011-06-26 | Payer: Medicare Other | Source: Ambulatory Visit | Admitting: Urology

## 2011-07-21 LAB — HEPATIC FUNCTION PANEL
ALT: 17
AST: 24
Albumin: 3.9
Alkaline Phosphatase: 71
Bilirubin, Direct: 0.1
Indirect Bilirubin: 0.7
Total Bilirubin: 0.8
Total Protein: 6.8

## 2011-07-21 LAB — POCT I-STAT, CHEM 8
BUN: 11
Calcium, Ion: 1.16
Chloride: 103
Creatinine, Ser: 1.3
Glucose, Bld: 135 — ABNORMAL HIGH
HCT: 46
Hemoglobin: 15.6
Potassium: 3.9
Sodium: 139
TCO2: 26

## 2011-07-21 LAB — CBC
HCT: 42.5
Hemoglobin: 14.3
MCHC: 33.6
MCV: 92
Platelets: 179
RBC: 4.62
RDW: 13.3
WBC: 4.7

## 2011-07-21 LAB — DIFFERENTIAL
Basophils Absolute: 0
Basophils Relative: 0
Eosinophils Absolute: 0
Eosinophils Relative: 0
Lymphocytes Relative: 32
Lymphs Abs: 1.5
Monocytes Absolute: 0.3
Monocytes Relative: 7
Neutro Abs: 2.9
Neutrophils Relative %: 61

## 2011-07-21 LAB — CK TOTAL AND CKMB (NOT AT ARMC)
CK, MB: 3
Relative Index: 1.9
Total CK: 155

## 2011-07-21 LAB — LIPASE, BLOOD: Lipase: 27

## 2011-07-21 LAB — POCT H PYLORI SCREEN: H. PYLORI SCREEN, POC: NEGATIVE

## 2011-07-21 LAB — TROPONIN I: Troponin I: 0.01

## 2011-07-24 ENCOUNTER — Other Ambulatory Visit: Payer: Self-pay | Admitting: Urology

## 2011-07-24 ENCOUNTER — Ambulatory Visit (HOSPITAL_BASED_OUTPATIENT_CLINIC_OR_DEPARTMENT_OTHER)
Admission: RE | Admit: 2011-07-24 | Discharge: 2011-07-24 | Disposition: A | Discharge status: Home / Self Care | Payer: Medicare Other | Source: Ambulatory Visit | Attending: Urology | Admitting: Urology

## 2011-07-24 ENCOUNTER — Ambulatory Visit (HOSPITAL_COMMUNITY)
Admission: RE | Admit: 2011-07-24 | Discharge: 2011-07-24 | Disposition: A | Discharge status: Home / Self Care | Payer: Medicare Other | Source: Ambulatory Visit | Attending: Urology | Admitting: Urology

## 2011-07-24 DIAGNOSIS — E7800 Pure hypercholesterolemia, unspecified: Secondary | ICD-10-CM | POA: Insufficient documentation

## 2011-07-24 DIAGNOSIS — I10 Essential (primary) hypertension: Secondary | ICD-10-CM | POA: Insufficient documentation

## 2011-07-24 DIAGNOSIS — Z01810 Encounter for preprocedural cardiovascular examination: Secondary | ICD-10-CM | POA: Insufficient documentation

## 2011-07-24 DIAGNOSIS — R972 Elevated prostate specific antigen [PSA]: Secondary | ICD-10-CM | POA: Insufficient documentation

## 2011-07-24 DIAGNOSIS — N400 Enlarged prostate without lower urinary tract symptoms: Secondary | ICD-10-CM | POA: Insufficient documentation

## 2011-07-24 DIAGNOSIS — Z01818 Encounter for other preprocedural examination: Secondary | ICD-10-CM | POA: Insufficient documentation

## 2011-07-24 DIAGNOSIS — Z79899 Other long term (current) drug therapy: Secondary | ICD-10-CM | POA: Insufficient documentation

## 2011-07-24 DIAGNOSIS — Z01812 Encounter for preprocedural laboratory examination: Secondary | ICD-10-CM | POA: Insufficient documentation

## 2011-07-24 LAB — POCT I-STAT 4, (NA,K, GLUC, HGB,HCT)
Glucose, Bld: 133 mg/dL — ABNORMAL HIGH (ref 70–99)
HCT: 45 % (ref 39.0–52.0)
Hemoglobin: 15.3 g/dL (ref 13.0–17.0)
Potassium: 3.9 mEq/L (ref 3.5–5.1)
Sodium: 138 mEq/L (ref 135–145)

## 2011-07-24 LAB — GLUCOSE, CAPILLARY: Glucose-Capillary: 138 mg/dL — ABNORMAL HIGH (ref 70–99)

## 2011-07-26 NOTE — Op Note (Signed)
  NAME:  Frederick Cisneros, Frederick Cisneros NO.:  [id number]  MEDICAL RECORD NO.:  [id number]  LOCATION:  XRAY                         FACILITY:  [facility]  PHYSICIAN:  Bertram Millard. Fiorela Pelzer, M.D.DATE OF BIRTH:  1941/05/05  DATE OF PROCEDURE:  07/23/2011 DATE OF DISCHARGE:                              OPERATIVE REPORT   PREOPERATIVE DIAGNOSIS:  BPH, symptomatic on maximal medical therapy.  POSTOPERATIVE DIAGNOSIS:  BPH, symptomatic on maximal medical therapy.  SURGICAL PROCEDURES:  TURP with gyrus.  SURGEON:  Bertram Millard. Tecora Eustache, M.D.  ANESTHESIA:  General with LMA.  COMPLICATIONS:  None.  BRIEF HISTORY:  A 70 year old male who has troubling urinary symptomatology, despite being on 5-alpha-reductase inhibitors and alpha- blockers.  He has requested surgical management of this.  Risks and complications of TURP have been discussed with the patient.  He understands these and desires to proceed.  DESCRIPTION OF PROCEDURE:  The patient was identified in holding area and received preoperative IV antibiotics.  He was taken to the operating room where general anesthetic was administered using the LMA.  He was placed in the dorsal lithotomy position.  Genitalia and perineum were prepped and draped.  Time-out was then performed.  A 28-French resectoscope sheath was then placed, and the resectoscope with 12 degree lens was placed.  The bladder was inspected.  No lesions were seen.  There was a very high-riding, obstructing median lobe. Ureteral orifices were identified well away from this.  There was bilateral hypertrophy with obstruction.  Resection then began.  I first resected the median lobe, down to the bladder neck, taking care to avoid injury to the ureteral orifices.  The median lobe was resected all the way from the bladder neck to verumontanum, which was left/preserved. The right and then the left lateral lobes were then resected down to the surgical capsule.  Some small  capsular perforations were performed bilaterally, the vessels in this area were carefully coagulated.  The prostate was resected and there was a wide open fossa at this point. Apical tissue was resected.  The chips were irrigated from the bladder.  Hemostasis was performed.  At this point, the scope was removed.  I placed a 22-French 3-way Foley catheter and filled the balloon with 40 cc of water and put this to traction and overhead irrigation.  The patient tolerated the procedure well.  He was awakened and then taken to PACU in stable condition.     Bertram Millard. Retta Diones, M.D.     SMD/MEDQ  D:  07/24/2011  T:  07/24/2011  Job:  [id number]  Electronically Signed by Marcine Matar M.D. on 07/26/2011 10:18:58 AM

## 2011-10-09 ENCOUNTER — Other Ambulatory Visit (HOSPITAL_COMMUNITY): Payer: Self-pay | Admitting: Family Medicine

## 2011-10-09 ENCOUNTER — Ambulatory Visit (HOSPITAL_COMMUNITY)
Admission: RE | Admit: 2011-10-09 | Discharge: 2011-10-09 | Disposition: A | Discharge status: Home / Self Care | Payer: Medicare Other | Source: Ambulatory Visit | Attending: Family Medicine | Admitting: Family Medicine

## 2011-10-09 DIAGNOSIS — K4090 Unilateral inguinal hernia, without obstruction or gangrene, not specified as recurrent: Secondary | ICD-10-CM | POA: Insufficient documentation

## 2011-10-09 DIAGNOSIS — R109 Unspecified abdominal pain: Secondary | ICD-10-CM | POA: Insufficient documentation

## 2011-10-09 DIAGNOSIS — N400 Enlarged prostate without lower urinary tract symptoms: Secondary | ICD-10-CM | POA: Insufficient documentation

## 2011-10-09 DIAGNOSIS — K5732 Diverticulitis of large intestine without perforation or abscess without bleeding: Secondary | ICD-10-CM | POA: Insufficient documentation

## 2011-10-09 DIAGNOSIS — R197 Diarrhea, unspecified: Secondary | ICD-10-CM | POA: Insufficient documentation

## 2011-10-09 LAB — CREATININE, SERUM
Creatinine, Ser: 1.4 mg/dL — ABNORMAL HIGH (ref 0.50–1.35)
GFR calc Af Amer: 57 mL/min — ABNORMAL LOW
GFR calc non Af Amer: 49 mL/min — ABNORMAL LOW

## 2011-10-09 LAB — BUN: BUN: 16 mg/dL (ref 6–23)

## 2011-10-09 MED ORDER — IOHEXOL 300 MG/ML  SOLN
100.0000 mL | Freq: Once | INTRAMUSCULAR | Status: AC | PRN
  Administered 2011-10-09: 100 mL via INTRAVENOUS

## 2011-11-23 ENCOUNTER — Ambulatory Visit: Payer: Medicare Other | Admitting: Gastroenterology

## 2011-11-25 ENCOUNTER — Ambulatory Visit (INDEPENDENT_AMBULATORY_CARE_PROVIDER_SITE_OTHER): Payer: 59 | Admitting: Gastroenterology

## 2011-11-25 ENCOUNTER — Encounter: Payer: Self-pay | Admitting: Gastroenterology

## 2011-11-25 ENCOUNTER — Other Ambulatory Visit (INDEPENDENT_AMBULATORY_CARE_PROVIDER_SITE_OTHER): Payer: 59

## 2011-11-25 ENCOUNTER — Telehealth: Payer: Self-pay | Admitting: Gastroenterology

## 2011-11-25 DIAGNOSIS — K219 Gastro-esophageal reflux disease without esophagitis: Secondary | ICD-10-CM

## 2011-11-25 DIAGNOSIS — R1012 Left upper quadrant pain: Secondary | ICD-10-CM

## 2011-11-25 DIAGNOSIS — R195 Other fecal abnormalities: Secondary | ICD-10-CM

## 2011-11-25 DIAGNOSIS — K625 Hemorrhage of anus and rectum: Secondary | ICD-10-CM

## 2011-11-25 DIAGNOSIS — K5730 Diverticulosis of large intestine without perforation or abscess without bleeding: Secondary | ICD-10-CM

## 2011-11-25 LAB — HEPATIC FUNCTION PANEL
ALT: 21 U/L (ref 0–53)
AST: 26 U/L (ref 0–37)
Albumin: 4.2 g/dL (ref 3.5–5.2)
Alkaline Phosphatase: 57 U/L (ref 39–117)
Bilirubin, Direct: 0.1 mg/dL (ref 0.0–0.3)
Total Bilirubin: 0.6 mg/dL (ref 0.3–1.2)
Total Protein: 7.4 g/dL (ref 6.0–8.3)

## 2011-11-25 LAB — CBC WITH DIFFERENTIAL/PLATELET
Basophils Absolute: 0 10*3/uL (ref 0.0–0.1)
Basophils Relative: 0.3 % (ref 0.0–3.0)
Eosinophils Absolute: 0.1 10*3/uL (ref 0.0–0.7)
Eosinophils Relative: 1.9 % (ref 0.0–5.0)
HCT: 42.5 % (ref 39.0–52.0)
Hemoglobin: 14.4 g/dL (ref 13.0–17.0)
Lymphocytes Relative: 43.6 % (ref 12.0–46.0)
Lymphs Abs: 2.8 10*3/uL (ref 0.7–4.0)
MCHC: 33.9 g/dL (ref 30.0–36.0)
MCV: 90.2 fl (ref 78.0–100.0)
Monocytes Absolute: 0.6 10*3/uL (ref 0.1–1.0)
Monocytes Relative: 9.4 % (ref 3.0–12.0)
Neutro Abs: 2.9 10*3/uL (ref 1.4–7.7)
Neutrophils Relative %: 44.8 % (ref 43.0–77.0)
Platelets: 161 10*3/uL (ref 150.0–400.0)
RBC: 4.71 Mil/uL (ref 4.22–5.81)
RDW: 14.8 % — ABNORMAL HIGH (ref 11.5–14.6)
WBC: 6.4 10*3/uL (ref 4.5–10.5)

## 2011-11-25 LAB — BASIC METABOLIC PANEL
BUN: 16 mg/dL (ref 6–23)
CO2: 27 mEq/L (ref 19–32)
Calcium: 9.5 mg/dL (ref 8.4–10.5)
Chloride: 105 mEq/L (ref 96–112)
Creatinine, Ser: 1.5 mg/dL (ref 0.4–1.5)
GFR: 60.25 mL/min (ref >60.00)
Glucose, Bld: 124 mg/dL — ABNORMAL HIGH (ref 70–99)
Potassium: 4.1 mEq/L (ref 3.5–5.1)
Sodium: 141 mEq/L (ref 135–145)

## 2011-11-25 LAB — VITAMIN B12: Vitamin B-12: 936 pg/mL — ABNORMAL HIGH (ref 211–911)

## 2011-11-25 LAB — IBC PANEL
Iron: 74 ug/dL (ref 42–165)
Saturation Ratios: 24.4 % (ref 20.0–50.0)
Transferrin: 216.4 mg/dL (ref 212.0–360.0)

## 2011-11-25 LAB — TSH: TSH: 1.26 u[IU]/mL (ref 0.35–5.50)

## 2011-11-25 LAB — FERRITIN: Ferritin: 101.1 ng/mL (ref 22.0–322.0)

## 2011-11-25 LAB — FOLATE: Folate: >24.8 ng/mL (ref >5.9)

## 2011-11-25 MED ORDER — PEG-KCL-NACL-NASULF-NA ASC-C 100 G PO SOLR: 1.0000 | Freq: Once | ORAL | Status: DC

## 2011-11-25 NOTE — Progress Notes (Signed)
History of Present Illness:  This is a somewhat complicated 71 year old African American male referred for evaluation of lower abdominal pain with documented diverticulitis December 21. He was treated with Cipro and Flagyl and had good response not lower abdominal pain at this time. His main complaint to me is continued upper normal discomfort in his left upper quadrant radiating into his chest with rather typical acid reflux symptoms despite taking Protonix 40 mg a day. Endoscopy 2 years ago was fairly unremarkable with negative exam for H. pylori. Last colonoscopy was done by Dr. Loreta Ave 2008. I do not have those records for review. I can see no history of hepatitis, pancreatitis, and he denies any hepatobiliary complaints. His appetite is good and his weight is stable. Denies melena, hematochezia, or systemic complaints. He does not smoke or abuse ethanol or take frequent NSAIDs. Review of labs from December show a normal CBC. Patient is status post TURP.  I have reviewed this patient's present history, medical and surgical past history, allergies and medications.     ROS: The remainder of the 10 point ROS is negative     Physical Exam: General well developed well nourished patient in no acute distress, appearing his stated age Eyes PERRLA, no icterus, fundoscopic exam per opthamologist Skin no lesions noted Neck supple, no adenopathy, no thyroid enlargement, no tenderness Chest clear to percussion and auscultation Heart no significant murmurs, gallops or rubs noted Abdomen no hepatosplenomegaly masses or tenderness, BS normal.  Rectal inspection normal no fissures, or fistulae noted.  No masses or tenderness on digital exam. Stool guaiac +1 positive. Extremities no acute joint lesions, edema, phlebitis or evidence of cellulitis. Neurologic patient oriented x 3, cranial nerves intact, no focal neurologic deficits noted. Psychological mental status normal and normal affect.  Assessment and  plan: Apparently this patient has had several episodes of diverticulitis over the last several months. I am concerned about his guaiac positive stools, and I think he needs colonoscopy exam with propofol sedation. This has been scheduled, and we also will repeat his endoscopy because of his continued upper GI complaints. CT scan also revealed an enlarged prostate and a right inguinal hernia. He may need gallbladder ultrasound exam done. Repeat renal labs also ordered today. Is to continue other medications as reviewed per primary care.  Encounter Diagnoses  Name Primary?  . Esophageal reflux   . Rectal bleeding

## 2011-11-25 NOTE — Telephone Encounter (Signed)
lmom for pt to call back

## 2011-11-25 NOTE — Patient Instructions (Signed)
Your procedure has been scheduled for 11/27/2011, please follow the seperate instructions.  Please go to the basement today for your labs.  Your prescription(s) have been sent to you pharmacy.

## 2011-11-26 ENCOUNTER — Encounter: Payer: Self-pay | Admitting: Gastroenterology

## 2011-11-27 ENCOUNTER — Other Ambulatory Visit: Payer: Self-pay | Admitting: Gastroenterology

## 2011-11-27 ENCOUNTER — Ambulatory Visit (AMBULATORY_SURGERY_CENTER): Payer: 59 | Admitting: Gastroenterology

## 2011-11-27 ENCOUNTER — Encounter: Payer: Self-pay | Admitting: Gastroenterology

## 2011-11-27 DIAGNOSIS — K5289 Other specified noninfective gastroenteritis and colitis: Secondary | ICD-10-CM

## 2011-11-27 DIAGNOSIS — K219 Gastro-esophageal reflux disease without esophagitis: Secondary | ICD-10-CM

## 2011-11-27 DIAGNOSIS — R195 Other fecal abnormalities: Secondary | ICD-10-CM

## 2011-11-27 DIAGNOSIS — R1013 Epigastric pain: Secondary | ICD-10-CM

## 2011-11-27 DIAGNOSIS — K2970 Gastritis, unspecified, without bleeding: Secondary | ICD-10-CM

## 2011-11-27 DIAGNOSIS — K5730 Diverticulosis of large intestine without perforation or abscess without bleeding: Secondary | ICD-10-CM

## 2011-11-27 DIAGNOSIS — K625 Hemorrhage of anus and rectum: Secondary | ICD-10-CM | POA: Insufficient documentation

## 2011-11-27 DIAGNOSIS — K529 Noninfective gastroenteritis and colitis, unspecified: Secondary | ICD-10-CM | POA: Insufficient documentation

## 2011-11-27 DIAGNOSIS — R1012 Left upper quadrant pain: Secondary | ICD-10-CM

## 2011-11-27 DIAGNOSIS — K2990 Gastroduodenitis, unspecified, without bleeding: Secondary | ICD-10-CM

## 2011-11-27 MED ORDER — SODIUM CHLORIDE 0.9 % IV SOLN: 500.0000 mL | INTRAVENOUS | Status: DC

## 2011-11-27 MED ORDER — MESALAMINE ER 0.375 G PO CP24: ORAL_CAPSULE | ORAL | Status: DC

## 2011-11-27 NOTE — Patient Instructions (Signed)
Discharge instructions given with verbal understanding. Handouts on diverticulosis and a high fiber diet given. Also information on hiatal hernia and gastritis given. Resume previous medications.

## 2011-11-27 NOTE — Op Note (Signed)
[facility] [address] [place], [place]  [address]  COLONOSCOPY PROCEDURE REPORT  PATIENT:  Frederick Cisneros, Frederick Cisneros  MR#:  [id number] BIRTHDATE:  April 08, 1941, 71 yrs. old  GENDER:  male ENDOSCOPIST:  Vania Rea. Jarold Motto, MD, [facility] REF. BY: PROCEDURE DATE:  11/27/2011 PROCEDURE:  Average-risk screening colonoscopy G0121 ASA CLASS:  Class III INDICATIONS:  Abdominal pain, Routine Risk Screening MEDICATIONS:   propofol (Diprivan) 200 mg IV  DESCRIPTION OF PROCEDURE:   After the risks and benefits and of the procedure were explained, informed consent was obtained. Digital rectal exam was performed and revealed no abnormalities. The LB 180AL [id number] endoscope was introduced through the anus and advanced to the cecum, which was identified by both the appendix and ileocecal valve.  The quality of the prep was excellent, using MoviPrep.  The instrument was then slowly withdrawn as the colon was fully examined. <<PROCEDUREIMAGES>>  FINDINGS:  Severe diverticulosis was found in the sigmoid to descending colon segments. inflammed,red edematous mucosa and erosions noted in sigmoid area.  This was otherwise a normal examination of the colon.  No polyps or cancers were seen. Retroflexed views in the rectum revealed no abnormalities.    The scope was then withdrawn from the patient and the procedure completed.  COMPLICATIONS:  None ENDOSCOPIC IMPRESSION: 1) Severe diverticulosis in the sigmoid to descending colon segments 2) Otherwise normal examination 3) No polyps or cancers Segmental Colitis and severe diverticulosi in left colon. RECOMMENDATIONS: 1) High fiber diet with liberal fluid intake. 2) metamucil or benefiber APRISO 4 TABS DAILY.SEE ME 1 MONTH.  REPEAT EXAM:  No  ______________________________ Vania Rea. Jarold Motto, MD, [facility]  CC:  Johny Blamer MD  n. Rosalie Doctor:   Vania Rea. Candi Profit at 11/27/2011 03:32 PM  Audry Riles, [phone]

## 2011-11-27 NOTE — Progress Notes (Signed)
Patient did not experience any of the following events: a burn prior to discharge; a fall within the facility; wrong site/side/patient/procedure/implant event; or a hospital transfer or hospital admission upon discharge from the facility. [phone]) Patient did not have preoperative order for IV antibiotic SSI prophylaxis. [phone])  Patient alert talking. Denies any discomfort.oxygen level WNL. Color WNL.

## 2011-11-27 NOTE — Progress Notes (Signed)
propofol per l beeson crna- see scanned intra procedure report. ewm  At end of endoscopy, pts sats decreased to 46% . o2 increased to 10liters per l beeson crna, having increased breathing effort. Pt assisted with bag and mask for 3 minutes per l beeson crna and md made aware. pts sats increased to 98% after being bag assisted and md at bedside. Pt sleeping and not waking with mild stimulation. Pt breathing more easy. o2 decreased to 2 liters via nasal cannula and crna at the bedside the entire time. pts bp stable with entire episode at 130/68 and heart rate stable in the 70's . Skin warm and dry and not diaphoretic. 02 sats maintaining 93-94 % and pt coughing and breathing easy. All vital signs stable at this point. 115/82. Pt transferred to recovery stable. ewm

## 2011-11-27 NOTE — Op Note (Signed)
[facility] [address] [place], [place]  [address]  ENDOSCOPY PROCEDURE REPORT  PATIENT:  Frederick Cisneros, Frederick Cisneros  MR#:  [id number] BIRTHDATE:  07-13-1941, 71 yrs. old  GENDER:  male  ENDOSCOPIST:  Vania Rea. Jarold Motto, MD, [facility] Referred by:  PROCEDURE DATE:  11/27/2011 PROCEDURE:  EGD with biopsy, 43239, EGD with biopsy for H. pylori 62130 ASA CLASS:  Class III INDICATIONS:  LUQ PAIN.  MEDICATIONS:   There was residual sedation effect present from prior procedure., propofol (Diprivan) 70 mg IV TOPICAL ANESTHETIC:  DESCRIPTION OF PROCEDURE:   After the risks and benefits of the procedure were explained, informed consent was obtained.  The LB GIF-H180 [id number] endoscope was introduced through the mouth and advanced to the second portion of the duodenum.  The instrument was slowly withdrawn as the mucosa was fully examined. <<PROCEDUREIMAGES>>  A hiatal hernia was found. 3 CM. HH NOTED.NO VARICES OR ESOPHAGITIS.  Moderate gastritis was found in the antrum. EROSIONS NOTED.CLO BX. DONE.  Otherwise the examination was normal. Retroflexed views revealed a hiatal hernia.    The scope was then withdrawn from the patient and the procedure completed.  COMPLICATIONS:  A complication of hypoxemia occurred on 11/27/2011 at.  DECREASED O2 SATURATION POST OP REQUIRING AMBU BAG VENTILATION FOR SEVERAL MINUTES,VS OTHERWISE STABLE.  ENDOSCOPIC IMPRESSION: 1) Hiatal hernia 2) Moderate gastritis in the antrum 3) Otherwise normal examination R/O H.PYLORI GASTRITIS VS NSAID INDUCED GASTRITIS. RECOMMENDATIONS: 1) Await biopsy results 2) Rx CLO if positive 3) continue current medications  ______________________________ Vania Rea. Jarold Motto, MD, [facility]  CC:  Johny Blamer MD  n. Rosalie Doctor:   Vania Rea. Soo Steelman at 11/27/2011 03:41 PM  Audry Riles, [phone]

## 2011-11-27 NOTE — Telephone Encounter (Signed)
Pt has an ECL today; sent Dr Jarold Motto a note to ask about the surgery.

## 2011-11-27 NOTE — Telephone Encounter (Signed)
lmom at number listed for pt to call back.

## 2011-11-30 ENCOUNTER — Encounter: Payer: Self-pay | Admitting: Gastroenterology

## 2011-11-30 ENCOUNTER — Other Ambulatory Visit: Payer: Self-pay | Admitting: Gastroenterology

## 2011-11-30 ENCOUNTER — Telehealth: Payer: Self-pay | Admitting: *Deleted

## 2011-11-30 LAB — HELICOBACTER PYLORI SCREEN-BIOPSY: UREASE: NEGATIVE

## 2011-11-30 NOTE — Telephone Encounter (Signed)
  Follow up Call-  Call back number 11/27/2011  Post procedure Call Back phone  # [phone]  Permission to leave phone message Yes     Patient questions:  Do you have a fever, pain , or abdominal swelling? no Pain Score  0 *  Have you tolerated food without any problems? yes  Have you been able to return to your normal activities? yes  Do you have any questions about your discharge instructions: Diet   no Medications  no Follow up visit  no  Do you have questions or concerns about your Care? no  Actions: * If pain score is 4 or above: No action needed, pain <4.

## 2011-11-30 NOTE — Telephone Encounter (Signed)
Called pharmacy and at first he started that there was no rx there for pt and I had him look again and he found it in a stack of papers that had no bee gone through, I advised him that the rx was sent in on 11/27/2011 and should have been filled then and the pt needs the rx filled now. I called pt and advised them of the same, he will go pick it up today.

## 2011-12-25 ENCOUNTER — Encounter: Payer: Self-pay | Admitting: Gastroenterology

## 2011-12-25 ENCOUNTER — Ambulatory Visit (INDEPENDENT_AMBULATORY_CARE_PROVIDER_SITE_OTHER): Payer: 59 | Admitting: Gastroenterology

## 2011-12-25 VITALS — BP 134/72 | HR 76 | Ht 65.5 in | Wt 171.4 lb

## 2011-12-25 DIAGNOSIS — K6289 Other specified diseases of anus and rectum: Secondary | ICD-10-CM | POA: Insufficient documentation

## 2011-12-25 DIAGNOSIS — K219 Gastro-esophageal reflux disease without esophagitis: Secondary | ICD-10-CM

## 2011-12-25 DIAGNOSIS — K5730 Diverticulosis of large intestine without perforation or abscess without bleeding: Secondary | ICD-10-CM

## 2011-12-25 MED ORDER — PRAMOXINE-HC 1-1 % EX CREA: TOPICAL_CREAM | CUTANEOUS | Status: AC

## 2011-12-25 NOTE — Patient Instructions (Signed)
We have sent the following medications to your pharmacy for you to pick up at your convenience:  Analpram  Dr. Jarold Motto has asked that you stop taking your Apriso.    We have given you some information on high fiber diet.  Please continue taking metamucil daily

## 2011-12-25 NOTE — Progress Notes (Signed)
History of Present Illness: This is a 71 year old African American male who has had resolved diverticulitis and a recent negative colonoscopy except for mild residual segmental colitis. He has been on by mouth aminosalicylates and is currently asymptomatic except for rectal burning and itching. Normal high-fiber diet with daily fiber supplements and liberal by mouth fluids. He denies abdominal pain, rectal bleeding, upper GI or hepatobiliary complaints.    Current Medications, Allergies, Past Medical History, Past Surgical History, Family History and Social History were reviewed in [employer] record.   Assessment and plans: Continue high-fiber diet for his diverticulosis. I discontinued his Apriso this he seems to have some sensitization  with rectal burning with this medication. Prescribed when necessary Analpram cream locally, also daily Metamucil. We will see him on a when necessary basis as needed.

## 2012-01-26 ENCOUNTER — Other Ambulatory Visit: Payer: Self-pay | Admitting: Gastroenterology

## 2012-12-13 ENCOUNTER — Ambulatory Visit (INDEPENDENT_AMBULATORY_CARE_PROVIDER_SITE_OTHER): Payer: 59 | Admitting: Gastroenterology

## 2012-12-13 ENCOUNTER — Other Ambulatory Visit (INDEPENDENT_AMBULATORY_CARE_PROVIDER_SITE_OTHER): Payer: 59

## 2012-12-13 ENCOUNTER — Encounter: Payer: Self-pay | Admitting: Gastroenterology

## 2012-12-13 VITALS — BP 152/68 | HR 88 | Ht 65.5 in | Wt 162.1 lb

## 2012-12-13 DIAGNOSIS — K5730 Diverticulosis of large intestine without perforation or abscess without bleeding: Secondary | ICD-10-CM

## 2012-12-13 DIAGNOSIS — R109 Unspecified abdominal pain: Secondary | ICD-10-CM

## 2012-12-13 DIAGNOSIS — K589 Irritable bowel syndrome without diarrhea: Secondary | ICD-10-CM

## 2012-12-13 LAB — IBC PANEL
Iron: 51 ug/dL (ref 42–165)
Saturation Ratios: 15 % — ABNORMAL LOW (ref 20.0–50.0)
Transferrin: 242.8 mg/dL (ref 212.0–360.0)

## 2012-12-13 LAB — COMPREHENSIVE METABOLIC PANEL
ALT: 21 U/L (ref 0–53)
AST: 28 U/L (ref 0–37)
Albumin: 4.5 g/dL (ref 3.5–5.2)
Alkaline Phosphatase: 72 U/L (ref 39–117)
BUN: 13 mg/dL (ref 6–23)
CO2: 30 mEq/L (ref 19–32)
Calcium: 9.7 mg/dL (ref 8.4–10.5)
Chloride: 102 mEq/L (ref 96–112)
Creatinine, Ser: 1.5 mg/dL (ref 0.4–1.5)
GFR: 61.5 mL/min (ref >60.00)
Glucose, Bld: 142 mg/dL — ABNORMAL HIGH (ref 70–99)
Potassium: 3.5 mEq/L (ref 3.5–5.1)
Sodium: 140 mEq/L (ref 135–145)
Total Bilirubin: 0.5 mg/dL (ref 0.3–1.2)
Total Protein: 8.2 g/dL (ref 6.0–8.3)

## 2012-12-13 LAB — SEDIMENTATION RATE: Sed Rate: 30 mm/hr — ABNORMAL HIGH (ref 0–22)

## 2012-12-13 LAB — HEPATIC FUNCTION PANEL
ALT: 21 U/L (ref 0–53)
AST: 28 U/L (ref 0–37)
Albumin: 4.5 g/dL (ref 3.5–5.2)
Alkaline Phosphatase: 72 U/L (ref 39–117)
Bilirubin, Direct: 0 mg/dL (ref 0.0–0.3)
Total Bilirubin: 0.5 mg/dL (ref 0.3–1.2)
Total Protein: 8.2 g/dL (ref 6.0–8.3)

## 2012-12-13 LAB — CBC WITH DIFFERENTIAL/PLATELET
Basophils Absolute: 0 10*3/uL (ref 0.0–0.1)
Basophils Relative: 0.3 % (ref 0.0–3.0)
Eosinophils Absolute: 0.1 10*3/uL (ref 0.0–0.7)
Eosinophils Relative: 0.9 % (ref 0.0–5.0)
HCT: 45.7 % (ref 39.0–52.0)
Hemoglobin: 15.3 g/dL (ref 13.0–17.0)
Lymphocytes Relative: 27.8 % (ref 12.0–46.0)
Lymphs Abs: 1.9 10*3/uL (ref 0.7–4.0)
MCHC: 33.4 g/dL (ref 30.0–36.0)
MCV: 92.4 fl (ref 78.0–100.0)
Monocytes Absolute: 0.5 10*3/uL (ref 0.1–1.0)
Monocytes Relative: 6.7 % (ref 3.0–12.0)
Neutro Abs: 4.4 10*3/uL (ref 1.4–7.7)
Neutrophils Relative %: 64.3 % (ref 43.0–77.0)
Platelets: 176 10*3/uL (ref 150.0–400.0)
RBC: 4.95 Mil/uL (ref 4.22–5.81)
RDW: 13.4 % (ref 11.5–14.6)
WBC: 6.8 10*3/uL (ref 4.5–10.5)

## 2012-12-13 LAB — C-REACTIVE PROTEIN: CRP: <0.2 mg/dL — ABNORMAL LOW (ref 0.5–20.0)

## 2012-12-13 LAB — LIPASE: Lipase: 40 U/L (ref 11.0–59.0)

## 2012-12-13 LAB — AMYLASE: Amylase: 84 U/L (ref 27–131)

## 2012-12-13 MED ORDER — CILIDINIUM-CHLORDIAZEPOXIDE 2.5-5 MG PO CAPS: 1.0000 | ORAL_CAPSULE | Freq: Two times a day (BID) | ORAL | Status: DC | PRN

## 2012-12-13 NOTE — Progress Notes (Signed)
This is a 72 year old African American male that I have seen for several years because of vague left upper quadrant discomfort, gas, bloating, and acid reflux symptoms.  Within the last 2 years he said CT scan of the abdomen, endoscopy and colonoscopy which are been unremarkable except for mild diverticulosis, and in the past he has been treated for diverticulitis.  He currently has regular bowel movements without melena or hematochezia.  He complains of almost daily dull discomfort in his left upper quadrant which radiates into the subxiphoid area and into his chest despite daily Protonix 40 mg.  There is no associated anorexia, weight loss, or dysphagia.  There is no history recurrent pancreatitis, hepatitis, and previous ultrasound has shown no evidence of cholelithiasis.  There are no real associated precipitating or alleviating events and no history of CVAs, MI's, peripheral vascular disease.  He denies abuse of alcohol, cigarettes or NSAIDs but is on aspirin 325 mg a day.  He is under a lot of personal stress with her wife who has Alzheimer's, and he relates that some of tapering his family including his son have been explained GI issues.  Current Medications, Allergies, Past Medical History, Past Surgical History, Family History and Social History were reviewed in [employer] record.  ROS: All systems were reviewed and are negative unless otherwise stated in the HPI.          Physical Exam: The appearing patient in no distress.  I cannot appreciate stigmata of chronic liver disease.  There is no lymphadenopathy, neck exam is normal.  Chest is entirely clear and he is in a regular rhythm without murmurs gallops or rubs.  There is no abdominal distention, organomegaly, masses or tenderness.  Bowel sounds are normal.  Peripheral extremities are unremarkable and mental status is normal.    Assessment and Plan: Functional GI issues exacerbated by recent personal stress.  His  had a thorough negative GI workup.  He is concerned that" we are missing something", and I have upper GI- small bowel series to be complete.  Also labs for review including amylase, lipase, liver profile and sedimentation rate.  Have asked continue his current medicines and will add Librax one by mouth twice a day as tolerated.  He may need referral to a tertiary Medical Center at his request.No diagnosis found.

## 2012-12-13 NOTE — Patient Instructions (Addendum)
You have been scheduled for an Upper GI with Small Bowel Series on 12-15-2012 at [facility] Radiology first floor at 10 am. Please arrive at 9:45 am for registration. Nothing to eat or drink after midnight.  Your physician has requested that you go to the basement for lab work before leaving today  We have sent the following medications to your pharmacy for you to pick up at your convenience: Librax, please take as directed.

## 2012-12-13 NOTE — Addendum Note (Signed)
Addended by: Ok Anis A on: 12/13/2012 04:34 PM   Modules accepted: Orders

## 2012-12-14 LAB — CELIAC PANEL 10
Endomysial Screen: NEGATIVE
Gliadin IgA: 17.6 U/mL (ref <20)
Gliadin IgG: 9.8 U/mL (ref <20)
IgA: 435 mg/dL — ABNORMAL HIGH (ref 68–379)
Tissue Transglut Ab: 9.3 U/mL (ref <20)
Tissue Transglutaminase Ab, IgA: 12.9 U/mL (ref <20)

## 2012-12-14 LAB — VITAMIN B12: Vitamin B-12: >1500 pg/mL — ABNORMAL HIGH (ref 211–911)

## 2012-12-14 LAB — FERRITIN: Ferritin: 154.5 ng/mL (ref 22.0–322.0)

## 2012-12-14 LAB — TSH: TSH: 1.26 u[IU]/mL (ref 0.35–5.50)

## 2012-12-14 LAB — FOLATE: Folate: >24.8 ng/mL (ref >5.9)

## 2012-12-15 ENCOUNTER — Ambulatory Visit (HOSPITAL_COMMUNITY)
Admission: RE | Admit: 2012-12-15 | Discharge: 2012-12-15 | Disposition: A | Discharge status: Home / Self Care | Payer: Medicare Other | Source: Ambulatory Visit | Attending: Gastroenterology | Admitting: Gastroenterology

## 2012-12-15 DIAGNOSIS — K225 Diverticulum of esophagus, acquired: Secondary | ICD-10-CM | POA: Insufficient documentation

## 2012-12-15 DIAGNOSIS — R109 Unspecified abdominal pain: Secondary | ICD-10-CM | POA: Insufficient documentation

## 2012-12-15 DIAGNOSIS — K2289 Other specified disease of esophagus: Secondary | ICD-10-CM | POA: Insufficient documentation

## 2012-12-15 DIAGNOSIS — K219 Gastro-esophageal reflux disease without esophagitis: Secondary | ICD-10-CM | POA: Insufficient documentation

## 2012-12-16 ENCOUNTER — Telehealth: Payer: Self-pay | Admitting: *Deleted

## 2012-12-16 NOTE — Telephone Encounter (Signed)
Pt given results of his UGI and instructed to increase protonix to BID; also, we refer him to [facility] if her prefers.  Pt reports he's doing better with the Librax, although it cost him >$85. He took it BID the 1st day, but only at bedtime the next, but it has helped. Informed pt he can use Mail Order for his meds and it will be cheaper. Called [facility] for him and started a Prior Auth for the Librax which usually takes 24-72 hours to complete. Informed pt to call [phone] to set up an account; he may also be able to order protonix and some of his other drugs thru them. Pt will let me know.

## 2012-12-16 NOTE — Telephone Encounter (Signed)
Message copied by Florene Glen on Fri Dec 16, 2012  9:42 AM ------      Message from: Jarold Motto, DAVID R      Created: Thu Dec 15, 2012 12:04 PM       Normal exam without evidence of bowel obstruction.  Please increase his PPI to twice a day.  If he wishes or his wife wishes we can refer him to [facility] for another opinion. ------

## 2013-01-06 ENCOUNTER — Other Ambulatory Visit: Payer: Self-pay | Admitting: Gastroenterology

## 2013-03-23 ENCOUNTER — Other Ambulatory Visit: Payer: Self-pay | Admitting: Gastroenterology

## 2013-03-29 ENCOUNTER — Other Ambulatory Visit: Payer: Self-pay | Admitting: *Deleted

## 2013-03-29 MED ORDER — OMEPRAZOLE 40 MG PO CPDR: 40.0000 mg | DELAYED_RELEASE_CAPSULE | Freq: Every day | ORAL | Status: DC

## 2013-03-29 NOTE — Telephone Encounter (Signed)
PER [facility] ON PANTOPRAZOLE NEED SUBSTITUTE SENT.  SENT OMEPRAZOLE 40 MG

## 2013-08-08 ENCOUNTER — Other Ambulatory Visit: Payer: Self-pay | Admitting: Family Medicine

## 2013-08-08 DIAGNOSIS — I739 Peripheral vascular disease, unspecified: Secondary | ICD-10-CM

## 2013-08-28 ENCOUNTER — Other Ambulatory Visit: Payer: 59

## 2013-08-29 ENCOUNTER — Ambulatory Visit
Admission: RE | Admit: 2013-08-29 | Discharge: 2013-08-29 | Disposition: A | Discharge status: Home / Self Care | Payer: Medicare Other | Source: Ambulatory Visit | Attending: Family Medicine | Admitting: Family Medicine

## 2013-08-29 DIAGNOSIS — I739 Peripheral vascular disease, unspecified: Secondary | ICD-10-CM

## 2013-09-26 ENCOUNTER — Encounter: Payer: Self-pay | Admitting: Gastroenterology

## 2013-09-26 ENCOUNTER — Ambulatory Visit (INDEPENDENT_AMBULATORY_CARE_PROVIDER_SITE_OTHER): Payer: Medicare Other | Admitting: Gastroenterology

## 2013-09-26 ENCOUNTER — Other Ambulatory Visit (INDEPENDENT_AMBULATORY_CARE_PROVIDER_SITE_OTHER): Payer: Medicare Other

## 2013-09-26 VITALS — BP 142/76 | HR 88 | Ht 65.5 in | Wt 165.0 lb

## 2013-09-26 DIAGNOSIS — G8929 Other chronic pain: Secondary | ICD-10-CM

## 2013-09-26 DIAGNOSIS — R1032 Left lower quadrant pain: Secondary | ICD-10-CM

## 2013-09-26 DIAGNOSIS — R1013 Epigastric pain: Secondary | ICD-10-CM

## 2013-09-26 DIAGNOSIS — E119 Type 2 diabetes mellitus without complications: Secondary | ICD-10-CM

## 2013-09-26 LAB — CBC WITH DIFFERENTIAL/PLATELET
Basophils Absolute: 0 10*3/uL (ref 0.0–0.1)
Basophils Relative: 0 % (ref 0.0–3.0)
Eosinophils Absolute: 0.1 10*3/uL (ref 0.0–0.7)
Eosinophils Relative: 2.1 % (ref 0.0–5.0)
HCT: 44.4 % (ref 39.0–52.0)
Hemoglobin: 14.7 g/dL (ref 13.0–17.0)
Lymphocytes Relative: 34.9 % (ref 12.0–46.0)
Lymphs Abs: 2 10*3/uL (ref 0.7–4.0)
MCHC: 33.2 g/dL (ref 30.0–36.0)
MCV: 90.3 fl (ref 78.0–100.0)
Monocytes Absolute: 0.5 10*3/uL (ref 0.1–1.0)
Monocytes Relative: 7.9 % (ref 3.0–12.0)
Neutro Abs: 3.2 10*3/uL (ref 1.4–7.7)
Neutrophils Relative %: 55.1 % (ref 43.0–77.0)
Platelets: 173 10*3/uL (ref 150.0–400.0)
RBC: 4.92 Mil/uL (ref 4.22–5.81)
RDW: 13.9 % (ref 11.5–14.6)
WBC: 5.9 10*3/uL (ref 4.5–10.5)

## 2013-09-26 LAB — FERRITIN: Ferritin: 180.2 ng/mL (ref 22.0–322.0)

## 2013-09-26 LAB — IBC PANEL
Iron: 99 ug/dL (ref 42–165)
Saturation Ratios: 29.8 % (ref 20.0–50.0)
Transferrin: 237.3 mg/dL (ref 212.0–360.0)

## 2013-09-26 LAB — LIPASE: Lipase: 37 U/L (ref 11.0–59.0)

## 2013-09-26 LAB — VITAMIN B12: Vitamin B-12: 880 pg/mL (ref 211–911)

## 2013-09-26 LAB — AMYLASE: Amylase: 84 U/L (ref 27–131)

## 2013-09-26 LAB — FOLATE: Folate: >24.8 ng/mL (ref >5.9)

## 2013-09-26 LAB — C-REACTIVE PROTEIN: CRP: <0.5 mg/dL (ref 0.5–20.0)

## 2013-09-26 LAB — SEDIMENTATION RATE: Sed Rate: 29 mm/hr — ABNORMAL HIGH (ref 0–22)

## 2013-09-26 MED ORDER — SUCRALFATE 1 GM/10ML PO SUSP: ORAL | Status: DC

## 2013-09-26 MED ORDER — NA SULFATE-K SULFATE-MG SULF 17.5-3.13-1.6 GM/177ML PO SOLN: ORAL | Status: DC

## 2013-09-26 NOTE — Patient Instructions (Signed)
You have been scheduled for an endoscopy and colonoscopy with propofol. Please follow the written instructions given to you at your visit today. Please pick up your prep at the pharmacy within the next 1-3 days. If you use inhalers (even only as needed), please bring them with you on the day of your procedure. Your physician has requested that you go to www.startemmi.com and enter the access code given to you at your visit today. This web site gives a general overview about your procedure. However, you should still follow specific instructions given to you by our office regarding your preparation for the procedure.  We have sent the following medications to your pharmacy for you to pick up at your convenience: Carafate  Your physician has requested that you go to the basement for lab work before leaving today.

## 2013-09-26 NOTE — Progress Notes (Signed)
This is a 72 year old African American male who continues with very atypical epigastric abdominal pain.  He's had this pain now for several years and she scribed is a sharp sensation in his epigastric area moves into his left lower quadrant and into his left hip.  He's been treated in the past for diverticulitis without much improvement.  Last endoscopy and colonoscopy was in February of 2014 and were unremarkable except for mild diverticulosis.  Trials of omeprazole therapy evaluate his pain worse.  His pain allegedly occurred around to 3:00 in the morning, awakens him from sleep, and last proximal penile in duration.  Is not associated with nausea vomiting, diarrhea, melena or hematochezia.  He's had no anorexia or weight loss.  He denies abuse of alcohol, cigarettes, or NSAIDs.  He does take a daily aspirin tablet.  Previous CT scans of the abdomen have been unremarkable in December 2012 suffer supposed diverticulitis.  He was treated at time with ciprofloxacin and metronidazole.  Subsequent upper GI and small bowel series was unremarkable without any evidence of small bowel obstruction or small bowel tumors.  Patient denies any hepatobiliary complaints, and had a negative gallbladder ultrasound in 2010.  He says his family history is positive for similar problems of unexplained abdominal pain.  There is no associated fever, chills, skin rashes, joint pains, oral stomatitis, or other systemic complaints.   Current Medications, Allergies, Past Medical History, Past Surgical History, Family History and Social History were reviewed in [employer] record.  ROS: All systems were reviewed and are negative unless otherwise stated in the HPI.          Physical Exam: Blood pressure 142/76, pulse 88 and regular and weight under and 65 pounds with a BMI of 27.03.  Cannot appreciate stigmata of chronic liver disease.  Chest is clear to percussion of dictation, no no murmurs gallops or rubs  noted.  The patient.is in a regular rhythm.  There is no hepatosplenomegaly, abdominal masses or tenderness.  Straight leg testing did not reveal any evidence of an abdominal wall hernia.  Bowel sounds are nonobstructive.  Peripheral extremities are unremarkable.  Inspection of rectum is unremarkable, there is formed stool present which is guaiac negative with a negative digital exam.  No status is normal.    Assessment and Plan: Unexplained abdominal pain the patient has had similar problems now for 3-4 years without any evidence of progression.  I suspect his problems are functional in nature, but we'll repeat his workup with followup endoscopy and colonoscopy, also repeat labs including amylase, lipase, CRP, sedimentation rate, and celiac panel.  He does workup is negative I will send him to surgery for their opinion as to possible internal/abdominal wall hernia.  He can stop his omeprazole since this seems to make his problems worse.  At the time of endoscopy we'll again check him for H. pylori infection.

## 2013-09-27 ENCOUNTER — Encounter: Payer: Self-pay | Admitting: Gastroenterology

## 2013-09-27 LAB — CELIAC PANEL 10
Endomysial Screen: NEGATIVE
Gliadin IgA: 12 U/mL (ref <20)
Gliadin IgG: 17.9 U/mL (ref <20)
IgA: 438 mg/dL — ABNORMAL HIGH (ref 68–379)
Tissue Transglut Ab: 12.6 U/mL (ref <20)
Tissue Transglutaminase Ab, IgA: 8.3 U/mL (ref <20)

## 2013-09-29 ENCOUNTER — Encounter: Payer: Self-pay | Admitting: Gastroenterology

## 2013-09-29 ENCOUNTER — Ambulatory Visit (AMBULATORY_SURGERY_CENTER): Payer: Medicare Other | Admitting: Gastroenterology

## 2013-09-29 VITALS — BP 111/59 | HR 59 | Temp 97.7°F | Resp 22 | Ht 65.5 in | Wt 165.0 lb

## 2013-09-29 DIAGNOSIS — R195 Other fecal abnormalities: Secondary | ICD-10-CM

## 2013-09-29 DIAGNOSIS — K219 Gastro-esophageal reflux disease without esophagitis: Secondary | ICD-10-CM

## 2013-09-29 DIAGNOSIS — Z1211 Encounter for screening for malignant neoplasm of colon: Secondary | ICD-10-CM

## 2013-09-29 DIAGNOSIS — R1032 Left lower quadrant pain: Secondary | ICD-10-CM

## 2013-09-29 DIAGNOSIS — R1013 Epigastric pain: Secondary | ICD-10-CM

## 2013-09-29 MED ORDER — SODIUM CHLORIDE 0.9 % IV SOLN: 500.0000 mL | INTRAVENOUS | Status: DC

## 2013-09-29 NOTE — Patient Instructions (Signed)

## 2013-09-29 NOTE — Progress Notes (Signed)
Procedure ends, to recovery, report given and VSS. 

## 2013-09-29 NOTE — Progress Notes (Signed)
Pt. Monitor did not connect with computer after first two sets of vital signs.  Nurse did monitor and vitals were stable. Patient did not experience any of the following events: a burn prior to discharge; a fall within the facility; wrong site/side/patient/procedure/implant event; or a hospital transfer or hospital admission upon discharge from the facility. ([id number])Patient did not have preoperative order for IV antibiotic SSI prophylaxis. [phone])

## 2013-09-29 NOTE — Op Note (Signed)
[facility] [address] [place], [address]   ENDOSCOPY PROCEDURE REPORT  PATIENT: Frederick Cisneros, Frederick Cisneros.  MR#: [id number] BIRTHDATE: 1940/12/10 , 72  yrs. old GENDER: Male ENDOSCOPIST:Jolinda Pinkstaff Hale Bogus, MD, [facility] REFERRED BY: PROCEDURE DATE:  09/29/2013 PROCEDURE:   EGD, screening ASA CLASS:    Class II INDICATIONS: Epigastric pain. MEDICATION: There was residual sedation effect present from prior procedure and Propofol (Diprivan) 70 mg IV TOPICAL ANESTHETIC:  DESCRIPTION OF PROCEDURE:   After the risks and benefits of the procedure were explained, informed consent was obtained.  The LB [id number]  endoscope was introduced through the mouth  and advanced to the second portion of the duodenum .  The instrument was slowly withdrawn as the mucosa was fully examined.    The upper, middle and distal third of the esophagus were carefully inspected and no abnormalities were noted.  The z-line was well seen at the GEJ.  The endoscope was pushed into the fundus which was normal including a retroflexed view.  The antrum, gastric body, first and second part of the duodenum were unremarkable. Retroflexed views revealed a small hiatal hernia.    The scope was then withdrawn from the patient and the procedure completed.  COMPLICATIONS: There were no complications.   ENDOSCOPIC IMPRESSION: Normal EGD ...no cause for LUQ pain noted.Small HH and ?? GERD.  RECOMMENDATIONS: Continue current medications    _______________________________ eSigned:  Mardella Layman, MD, [facility] 09/29/2013 2:29 PM

## 2013-09-29 NOTE — Op Note (Signed)
[facility] [address] [place], [address]   COLONOSCOPY PROCEDURE REPORT  PATIENT: Frederick, Cisneros.  MR#: [id number] BIRTHDATE: May 20, 1941 , 72  yrs. old GENDER: Male ENDOSCOPIST: Mardella Layman, MD, [facility] REFERRED BY: PROCEDURE DATE:  09/29/2013 PROCEDURE:   Colonoscopy, screening First Screening Colonoscopy - Avg.  risk and is 50 yrs.  old or older - No.  Prior Negative Screening - Now for repeat screening. N/A ASA CLASS:   Class II INDICATIONS:average risk screening and abdominal pain in the upper left quadrant. MEDICATIONS: Propofol (Diprivan) 130 mg IV  DESCRIPTION OF PROCEDURE:   After the risks benefits and alternatives of the procedure were thoroughly explained, informed consent was obtained.  A digital rectal exam revealed no abnormalities of the rectum.   The LB [id number]  endoscope was introduced through the anus and advanced to the cecum, which was identified by both the appendix and ileocecal valve. No adverse events experienced.   The quality of the prep was adequate, using MoviPrep  The instrument was then slowly withdrawn as the colon was fully examined.      COLON FINDINGS: Moderate diverticulosis was noted in the descending colon and sigmoid colon.   The colon was otherwise normal.  There was no diverticulosis, inflammation, polyps or cancers unless previously stated.  Retroflexed views revealed no abnormalities. The time to cecum=2 minutes 19 seconds.  Withdrawal time=5 minutes 37 seconds.  The scope was withdrawn and the procedure completed. COMPLICATIONS: There were no complications.  ENDOSCOPIC IMPRESSION: 1.   Moderate diverticulosis was noted in the descending colon and sigmoid colon 2.   The colon was otherwise normal  RECOMMENDATIONS: 1.  Continue current medications 2.  Continue current colorectal screening recommendations for "routine risk" patients with a repeat colonoscopy in 10 years. 3. EGD  today  eSigned:  Mardella Layman, MD, [facility] 09/29/2013 2:25 PM   cc:   PATIENT NAME:  Frederick Cisneros. MR#: [id number]

## 2013-10-02 ENCOUNTER — Telehealth: Payer: Self-pay | Admitting: *Deleted

## 2013-10-02 NOTE — Telephone Encounter (Signed)
  Follow up Call-  Call back number 09/29/2013 11/27/2011  Post procedure Call Back phone  # [phone] or [phone]  Permission to leave phone message Yes Yes     Patient questions:  Do you have a fever, pain , or abdominal swelling? no Pain Score  0 *  Have you tolerated food without any problems? yes  Have you been able to return to your normal activities? yes  Do you have any questions about your discharge instructions: Diet   no Medications  no Follow up visit  no  Do you have questions or concerns about your Care? no  Actions: * If pain score is 4 or above: No action needed, pain <4.

## 2015-01-02 ENCOUNTER — Other Ambulatory Visit: Payer: Self-pay | Admitting: Family Medicine

## 2015-01-02 DIAGNOSIS — R0989 Other specified symptoms and signs involving the circulatory and respiratory systems: Secondary | ICD-10-CM

## 2015-01-14 ENCOUNTER — Ambulatory Visit
Admission: RE | Admit: 2015-01-14 | Discharge: 2015-01-14 | Disposition: A | Discharge status: Home / Self Care | Payer: Medicare Other | Source: Ambulatory Visit | Attending: Family Medicine | Admitting: Family Medicine

## 2015-01-14 DIAGNOSIS — R0989 Other specified symptoms and signs involving the circulatory and respiratory systems: Secondary | ICD-10-CM

## 2015-01-17 ENCOUNTER — Other Ambulatory Visit: Payer: Self-pay | Admitting: Family Medicine

## 2015-01-17 DIAGNOSIS — I739 Peripheral vascular disease, unspecified: Secondary | ICD-10-CM

## 2015-01-21 ENCOUNTER — Other Ambulatory Visit: Payer: Self-pay | Admitting: *Deleted

## 2015-01-21 DIAGNOSIS — I739 Peripheral vascular disease, unspecified: Secondary | ICD-10-CM

## 2015-01-21 LAB — BUN: BUN: 23 mg/dL (ref 6–23)

## 2015-01-21 LAB — CREATININE WITH EST GFR
Creat: 1.57 mg/dL — ABNORMAL HIGH (ref 0.50–1.35)
GFR, Est African American: 49 mL/min — ABNORMAL LOW
GFR, Est Non African American: 43 mL/min — ABNORMAL LOW

## 2015-01-30 ENCOUNTER — Ambulatory Visit
Admission: RE | Admit: 2015-01-30 | Discharge: 2015-01-30 | Disposition: A | Discharge status: Home / Self Care | Payer: Medicare Other | Source: Ambulatory Visit | Attending: Family Medicine | Admitting: Family Medicine

## 2015-01-30 DIAGNOSIS — I739 Peripheral vascular disease, unspecified: Secondary | ICD-10-CM | POA: Insufficient documentation

## 2015-01-30 MED ORDER — IOHEXOL 350 MG/ML SOLN
140.0000 mL | Freq: Once | INTRAVENOUS | Status: AC | PRN
  Administered 2015-01-30: 140 mL via INTRAVENOUS

## 2015-01-30 NOTE — Consult Note (Signed)
Chief Complaint: Chief Complaint  Patient presents with  . Advice Only    Consult for PAD    Referring Physician(s): Harris,William  History of Present Illness: Frederick Cisneros. is a 74 y.o. male kindly referred by Dr. Tiburcio Pea for evaluation of short distance claudication.   Mr Sur arrived today for evaluation of his short distance claudication, affecting both his left and right lower extremity.  He describes the discomfort with an onset after approximately 50 yards, which necessitates resting, after which he can ambulate further.  This has been present for many years, but he reports that it has been worsening over the past few months.    He approached his physician for the evaluation, as he is convinced that this discomfort is affecting his day to day activities.  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    He reports bilateral discomfort in his thighs, buttocks, and calves after short distance of walking.  The discomfort goes away after a few minutes of rest, compatible with claudication type symptoms.  He has never had any ulcerations of his feet, and has no symptoms of rest pain.    His cardiovascular risk factors include: CAD, DM, Dyslipidemia, HTN, and known vascular disease, with prior left endarterectomy.   Past Medical History  Diagnosis Date  . Type II or unspecified type diabetes mellitus without mention of complication, not stated as uncontrolled   . Esophageal reflux   . BPH (benign prostatic hyperplasia)   . CAD (coronary artery disease)   . Dyslipidemia   . Hypertension   . LVH (left ventricular hypertrophy)   . Hiatal hernia   . Diverticulitis     Past Surgical History  Procedure Laterality Date  . Cardiac catheterization    . Rotator cuff repair      right  . Transurethral resection of prostate    .  Carotid endarterectomy      Allergies: Review of patient's allergies indicates no known allergies.  Medications: Prior to Admission medications   Medication Sig Start Date End Date Taking? Authorizing Provider  ALPRAZolam Prudy Feeler) 1 MG tablet Take 1 mg by mouth at bedtime.   Yes Historical Provider, MD  amLODipine (NORVASC) 5 MG tablet Take 1.5 tablet by mouth once a day   Yes Historical Provider, MD  aspirin 325 MG tablet Take 325 mg by mouth daily.   Yes Historical Provider, MD  Dexlansoprazole (DEXILANT PO) Take by mouth.   Yes Historical Provider, MD  ketoconazole (NIZORAL) 2 % cream  08/08/13  Yes Historical Provider, MD  lisinopril (PRINIVIL,ZESTRIL) 10 MG tablet  08/29/13  Yes Historical Provider, MD  metoprolol (LOPRESSOR) 100 MG tablet Take 100 mg by mouth 2 (two) times daily.   Yes Historical Provider, MD  Multiple Vitamins-Minerals (CENTRUM SILVER PO) Take by mouth daily.   Yes Historical Provider, MD  rosuvastatin (CRESTOR) 40 MG tablet Take 40 mg by mouth daily.   Yes Historical Provider, MD  triamterene-hydrochlorothiazide (MAXZIDE-25) 37.5-25 MG per tablet  09/01/13  Yes Historical Provider, MD  clidinium-chlordiazePOXIDE (LIBRAX) 2.5-5 MG per capsule Take 1 capsule by mouth 2 (two) times daily as needed. 12/13/12   Mardella Layman, MD  omeprazole (PRILOSEC) 40 MG capsule Take 1 capsule (40 mg total) by mouth daily. Patient not taking: Reported on 01/30/2015 03/29/13   Mardella Layman, MD  pantoprazole (PROTONIX) 40 MG tablet  09/17/13   Historical Provider, MD  sucralfate (CARAFATE) 1 GM/10ML suspension One tablespoon after meals and at bedtime 09/26/13   Mardella Layman, MD  zolpidem (AMBIEN) 10 MG tablet Take 10 mg by mouth at bedtime as needed.    Historical Provider, MD     Family History  Problem Relation Age of Onset  . Colon cancer Neg Hx   . Breast cancer Sister   . Diabetes Sister     x 3  . GI problems Father     type unknown    History   Social  History  . Marital Status: Married    Spouse Name: N/A  . Number of Children: 4  . Years of Education: N/A   Occupational History  . retired    Social History Main Topics  . Smoking status: Former Smoker    Quit date: 10/19/1988  . Smokeless tobacco: Never Used  . Alcohol Use: Yes     Comment: occasional wine  . Drug Use: No  . Sexual Activity: Not on file   Other Topics Concern  . None   Social History Narrative    Review of Systems: A 12 point ROS discussed and pertinent positives are indicated in the HPI above.  All other systems are negative.  Review of Systems  Vital Signs: BP 155/73 mmHg  Pulse 77  Temp(Src) 97.6 F (36.4 C) (Oral)  Resp 14  Ht 5' 5.5" (1.664 m)  Wt 162 lb (73.483 kg)  BMI 26.54 kg/m2  SpO2 98%  Physical Exam  Mallampati Score:  2  Imaging: Ct Angio Ao+bifem W/cm &/or Wo/cm  01/30/2015   CLINICAL DATA:  74 year old male with a history of bilateral lower extremity claudication.  Noninvasive examination 01/14/2015 demonstrates evidence of inflow disease.  EXAM: CT ANGIOGRAPHY OF ABDOMINAL AORTA WITH ILIOFEMORAL RUNOFF  TECHNIQUE: Multidetector CT imaging of the abdomen, pelvis and lower extremities was performed using the standard protocol during bolus administration of intravenous contrast. Multiplanar CT image reconstructions and MIPs were obtained to evaluate the vascular anatomy.  CONTRAST:  OMNIPAQUE IOHEXOL 350 MG/ML SOLN  COMPARISON:  Noninvasive examination 01/14/2015  FINDINGS: Nonvascular:  Lower chest:  Unremarkable appearance of the heart size. No pericardial fluid/thickening.  No confluent airspace disease. Respiratory rib motion somewhat limits evaluation for small nodules. No pleural effusion.  Small hiatal hernia.  Abdomen/pelvis:  Unremarkable appearance of liver, spleen, bilateral adrenal glands.  Unremarkable gallbladder and pancreatic parenchyma with no intra or extrahepatic biliary ductal dilatation.  No free air or  significant free fluid.  Small mesenteric lymph nodes, none of which appear enlarged. No retroperitoneal adenopathy.  No abnormally distended small bowel or colon. Colonic diverticula without evidence of associated inflammatory changes to suggest acute diverticulitis.  Normal appendix which is fluid filled with high density material.  Unremarkable appearance of the bilateral kidneys. No hydronephrosis. No evidence of nephrolithiasis. The right kidney is somewhat ptotic. Medial course of the right ureter, otherwise unremarkable.  The right apex of the urinary bladder extends towards right inguinal hernia.  Transverse diameter of the prostate measures 5.2 cm.  Vascular:  Aorta:  Diameter of the aorta at the aortic hiatus measures 2.4 cm.  No aneurysm of the abdominal aorta. Irregular soft and partially calcified plaque through the abdominal aorta. Septations in the infrarenal abdominal aorta favored to represent atherosclerotic changes or chronic dissection. The flow lumen does not appear significantly narrowed.  No periaortic fluid or inflammatory changes.  Mesenteric  vessels:  Celiac artery superior mesenteric artery and inferior mesenteric artery are patent. There appears to be narrowing at the origin of the celiac artery, without calcifications. Calcifications of the origin of superior mesenteric artery without significant stenosis. Soft plaque narrows the origin of the inferior mesenteric artery.  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 The larger is inferior on the abdominal aorta, below the level of the inferior mesenteric artery origin, and just above the origin of the right iliac artery. Atherosclerotic changes at the origin of both right renal arteries. Single left renal artery.  Right Lower Extremity:  Mixed calcified and soft atheromatous plaque involves the right common iliac artery, external iliac artery, and common femoral artery. The right hypogastric artery remains patent though appears narrow at  the origin. Atherosclerotic disease involves the pelvic vasculature. Septations of the common iliac artery may represent chronic dissection or ulceration of soft plaque. The flow lumen appears somewhat narrowed of the distal right common iliac artery.  Right common femoral artery demonstrates atherosclerotic changes. Patency of the profunda femoris. Patency of the circumflex femoral artery.  Right superficial femoral artery remains patent with mixed calcified and atheromatous plaque at the adductor canal. Atherosclerotic changes of the popliteal artery.  There appears to be three-vessel runoff to the ankle, with the contrast maintained of the posterior tibial artery, anterior tibial artery, and peroneal artery.  Left Lower Extremity:  Mixed calcified and soft atheromatous plaque of the left common iliac artery. There is significant narrowing of the flow lumen in the distal left common iliac artery/proximal left external iliac artery. The left hypogastric artery appears occluded at the origin with reconstitution via collateral flow from iliolumbar arteries. Atherosclerotic changes of the external iliac artery and common femoral artery.  Calcified and soft plaque of the left common femoral artery without significant narrowing. The profunda femoris remains patent as well as the lateral circumflex femoral artery.  Atherosclerotic changes of the left superficial femoral artery, with the most pronounced changes at the adductor canal. Minimal atherosclerotic disease of the left popliteal artery with suggestion of stenosis behind the knee.  There appears to be occlusion of the posterior tibial artery distally. Contrast column of the anterior tibial artery and peroneal artery suggest to vessel in-line flow to the ankle.  There appears to be reconstitution of the posterior tibial artery distally.  Musculoskeletal:  No displaced fracture.  Mild facet changes of the lower lumbar spine. No bony canal narrowing.  No significant  disc space loss or evidence of significant disc disease.  Relatively unremarkable appearance of the soft tissues.  Fat containing right inguinal hernia.  Review of the MIP images confirms the above findings.  IMPRESSION: Changes of atherosclerosis, including:  1. Aortic and bi-iliac disease. There is appears to be a high-grade stenosis of the left common iliac artery and potentially a moderate to high-grade stenosis of the right common iliac artery secondary to mixed atheromatous and calcified plaque. Left hypogastric artery is occluded, with stenosis at the origin the right epigastric artery.  2. Bilateral superficial femoral artery and popliteal artery disease without occlusion.  3. Atherosclerotic changes of the aorta, predominantly infrarenal with septations either representing atheromatous plaque or chronic dissection. No significant narrowing of the flow lumen.  4. Left posterior tibial artery occlusion.  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 The right ureter takes a medial course.  Signed,  Yvone Neu. Loreta Ave, DO  [facility]  [facility]   Electronically Signed   By: [name] D.O.   On: 01/30/2015 11:39   US Arterial Seg Multiple  01/14/2015   CLINICAL DATA:  74 year old male with a history of decreased pulse on physical examination.  Cardiovascular risk factors include hypertension, diabetes, previous smoking, known vascular disease with previous endarterectomy.  EXAM: NONINVASIVE PHYSIOLOGIC VASCULAR STUDY OF BILATERAL LOWER EXTREMITIES  TECHNIQUE: Evaluation of both lower extremities was performed at rest, including calculation of ankle-brachial indices, multiple segmental pressure evaluation, segmental  Doppler and segmental pulse volume recording.  COMPARISON:  08/29/2013  FINDINGS: Right:  Resting ankle brachial index:  0.81  Segmental blood pressure: The thigh segmental pressures are 10 mm of mercury below the brachial pressure. No significant drop from segment to segment of the right leg.  Doppler: Segmental Doppler of the right lower extremity demonstrates biphasic common femoral artery, monophasic popliteal artery, monophasic posterior tibial artery and monophasic dorsalis pedis artery.  Pulse volume recording: Segmental pulse volume recording demonstrates slight decreasing quality of the thigh waveforms with lack of augmentation from high thigh to below knee. Deterioration of the right great toe waveform.  Left:  Resting ankle brachial index: 0.68  Segmental blood pressure: Segmental pressures of the thigh are significantly below the left brachial pressure. Asymmetric pressures on the left compared to the right.  Doppler: Segmental Doppler studies of the left lower extremity demonstrates monophasic waveform of the common femoral artery, popliteal artery, posterior tibial artery and dorsalis pedis.  Pulse volume recording: Segmental pulse from recording of the left lower extremity demonstrates slight abnormal quality of the thigh waveforms with lack of augmentation from high thigh to below knee. Decreased amplitude at the left great toe.  IMPRESSION: Right:  Resting ankle brachial index demonstrates mild arterial occlusive disease of the right lower extremity, though the ABI likely drops after exercise.  Remainder of the noninvasive study suggests iliac/ inflow disease as well as femoral popliteal disease and tibial disease.  Left:  Resting ankle brachial index demonstrates moderate left-sided arterial occlusive disease. The ABI likely drops after exercise.  Remainder of the noninvasive study suggest significant left iliac/ inflow disease with femoral popliteal and tibial disease.  These results were called  by telephone at the time of interpretation on 01/14/2015 at 10:54 am to Dr. Johny Blamer , who verbally acknowledged these results.  Signed,  Yvone Neu. Loreta Ave, DO  [facility]  [facility]   Electronically Signed   By: [name] D.O.   On: 01/14/2015 10:54    Labs:  CBC: No results for input(s): WBC, HGB, HCT, PLT in the last 8760 hours.  COAGS: No results for input(s): INR, APTT in the last 8760 hours.  BMP:  Recent Labs  01/21/15 1230  BUN 23  CREATININE 1.57*  GFRNONAA 43*  GFRAA 49*    LIVER FUNCTION TESTS: No results for input(s): BILITOT, AST, ALT, ALKPHOS, PROT, ALBUMIN in the last 8760 hours.  TUMOR MARKERS: No results for input(s): AFPTM, CEA, CA199, CHROMGRNA in the last 8760 hours.  Assessment and Plan:  Mr Dingley has symptoms compatible with life-styling claudication, Rutherford 3 category.  His CTA completed today shows evidence of Left > Right iliac disease, which fits the waveform pattern identified on the prior non-invasive examination.    I had a long discussion with Mr  [name] about arterial disease, natural history, and how we can offer our endovascular services.  I had a complete informed consent regarding an endovascular repair, including a discussion of risks and benefits.  Specific risks include: bleeding, infection, contrast reaction, kidney injury, arterial injury/dissection, limb loss, hospitalization, further surgery/intervention, cardiopulmonary collapse/death.    I showed Mr [name] images from his CT, and discussed the findings of the occluded left hypogastric artery, and my feeling that we may not be able to improve buttock  claudication, however, potential treatment of the left iliac lesion could improve symptoms of his lower extremity claudication which is affecting his day to day.    Mr [name] is going to consider our conversation, and if he chooses to proceed, I would plan on an angiogram with  potential gradient measurement and treatment of left and/or right iliac stenoses.    I also discussed his femoro-popliteal disease, saying that this can be followed conservatively with current medical management and interval non-invasive examination.    From a medical standpoint, he may benefit from anti-platelet therapy with plavix, particularly if a stent is required.  Also, I would suggest a repeat carotid duplex, given his prior surgery and the long interval (>5 years, per the patient) from the previous.   Thank you for this interesting consult.  I greatly enjoyed meeting Debroah LoopClarence Vea Jr. and look forward to participating in his care.  SignedGilmer Mor: Olivya Sobol 01/30/2015, 12:31 PM   I spent a total of  45 Minutes   in face to face in clinical consultation, greater than 50% of which was counseling/coordinating care for Rutherford 3 claudication and PAD.   Signed,  Yvone NeuJaime S. Loreta AveWagner, DO

## 2015-03-27 ENCOUNTER — Other Ambulatory Visit (HOSPITAL_COMMUNITY): Payer: Self-pay | Admitting: Interventional Radiology

## 2015-03-27 DIAGNOSIS — I739 Peripheral vascular disease, unspecified: Secondary | ICD-10-CM

## 2015-04-09 ENCOUNTER — Other Ambulatory Visit: Payer: Self-pay | Admitting: Radiology

## 2015-04-10 ENCOUNTER — Other Ambulatory Visit: Payer: Self-pay | Admitting: Radiology

## 2015-04-11 ENCOUNTER — Encounter (HOSPITAL_COMMUNITY): Payer: Self-pay

## 2015-04-11 ENCOUNTER — Ambulatory Visit (HOSPITAL_COMMUNITY)
Admission: RE | Admit: 2015-04-11 | Discharge: 2015-04-11 | Disposition: A | Discharge status: Home / Self Care | Payer: Medicare Other | Source: Ambulatory Visit | Attending: Interventional Radiology | Admitting: Interventional Radiology

## 2015-04-11 ENCOUNTER — Other Ambulatory Visit (HOSPITAL_COMMUNITY): Payer: Self-pay | Admitting: Interventional Radiology

## 2015-04-11 DIAGNOSIS — K449 Diaphragmatic hernia without obstruction or gangrene: Secondary | ICD-10-CM | POA: Diagnosis not present

## 2015-04-11 DIAGNOSIS — I2510 Atherosclerotic heart disease of native coronary artery without angina pectoris: Secondary | ICD-10-CM | POA: Insufficient documentation

## 2015-04-11 DIAGNOSIS — K219 Gastro-esophageal reflux disease without esophagitis: Secondary | ICD-10-CM | POA: Diagnosis not present

## 2015-04-11 DIAGNOSIS — N289 Disorder of kidney and ureter, unspecified: Secondary | ICD-10-CM | POA: Insufficient documentation

## 2015-04-11 DIAGNOSIS — Z87891 Personal history of nicotine dependence: Secondary | ICD-10-CM | POA: Diagnosis not present

## 2015-04-11 DIAGNOSIS — E785 Hyperlipidemia, unspecified: Secondary | ICD-10-CM | POA: Diagnosis not present

## 2015-04-11 DIAGNOSIS — I739 Peripheral vascular disease, unspecified: Secondary | ICD-10-CM

## 2015-04-11 DIAGNOSIS — I70213 Atherosclerosis of native arteries of extremities with intermittent claudication, bilateral legs: Secondary | ICD-10-CM | POA: Diagnosis not present

## 2015-04-11 DIAGNOSIS — Z7982 Long term (current) use of aspirin: Secondary | ICD-10-CM | POA: Diagnosis not present

## 2015-04-11 DIAGNOSIS — G4733 Obstructive sleep apnea (adult) (pediatric): Secondary | ICD-10-CM | POA: Diagnosis not present

## 2015-04-11 DIAGNOSIS — E119 Type 2 diabetes mellitus without complications: Secondary | ICD-10-CM | POA: Insufficient documentation

## 2015-04-11 DIAGNOSIS — N400 Enlarged prostate without lower urinary tract symptoms: Secondary | ICD-10-CM | POA: Insufficient documentation

## 2015-04-11 DIAGNOSIS — I10 Essential (primary) hypertension: Secondary | ICD-10-CM | POA: Diagnosis not present

## 2015-04-11 LAB — BASIC METABOLIC PANEL
Anion gap: 9 (ref 5–15)
BUN: 22 mg/dL — ABNORMAL HIGH (ref 6–20)
CO2: 26 mmol/L (ref 22–32)
Calcium: 9.5 mg/dL (ref 8.9–10.3)
Chloride: 105 mmol/L (ref 101–111)
Creatinine, Ser: 1.63 mg/dL — ABNORMAL HIGH (ref 0.61–1.24)
GFR calc Af Amer: 46 mL/min — ABNORMAL LOW (ref >60)
GFR calc non Af Amer: 40 mL/min — ABNORMAL LOW (ref >60)
Glucose, Bld: 115 mg/dL — ABNORMAL HIGH (ref 65–99)
Potassium: 4 mmol/L (ref 3.5–5.1)
Sodium: 140 mmol/L (ref 135–145)

## 2015-04-11 LAB — CBC
HCT: 36.6 % — ABNORMAL LOW (ref 39.0–52.0)
Hemoglobin: 12.4 g/dL — ABNORMAL LOW (ref 13.0–17.0)
MCH: 30.2 pg (ref 26.0–34.0)
MCHC: 33.9 g/dL (ref 30.0–36.0)
MCV: 89.1 fL (ref 78.0–100.0)
Platelets: 187 10*3/uL (ref 150–400)
RBC: 4.11 MIL/uL — ABNORMAL LOW (ref 4.22–5.81)
RDW: 13.4 % (ref 11.5–15.5)
WBC: 6.5 10*3/uL (ref 4.0–10.5)

## 2015-04-11 LAB — PROTIME-INR
INR: 1.04 (ref 0.00–1.49)
Prothrombin Time: 13.8 seconds (ref 11.6–15.2)

## 2015-04-11 LAB — APTT: aPTT: 28 seconds (ref 24–37)

## 2015-04-11 MED ORDER — CLOPIDOGREL BISULFATE 75 MG PO TABS
75.0000 mg | ORAL_TABLET | Freq: Every day | ORAL | Status: DC
  Administered 2015-04-11: 75 mg via ORAL

## 2015-04-11 MED ORDER — FENTANYL CITRATE (PF) 100 MCG/2ML IJ SOLN
INTRAMUSCULAR | Status: AC
  Filled 2015-04-11: qty 4

## 2015-04-11 MED ORDER — MIDAZOLAM HCL 2 MG/2ML IJ SOLN
INTRAMUSCULAR | Status: AC
  Filled 2015-04-11: qty 6

## 2015-04-11 MED ORDER — SODIUM CHLORIDE 0.9 % IV SOLN: INTRAVENOUS | Status: AC

## 2015-04-11 MED ORDER — IODIXANOL 320 MG/ML IV SOLN
150.0000 mL | Freq: Once | INTRAVENOUS | Status: AC | PRN
  Administered 2015-04-11: 50 mL via INTRAVENOUS

## 2015-04-11 MED ORDER — CLOPIDOGREL BISULFATE 75 MG PO TABS: 75.0000 mg | ORAL_TABLET | Freq: Every day | ORAL | Status: DC

## 2015-04-11 MED ORDER — LIDOCAINE HCL 1 % IJ SOLN
INTRAMUSCULAR | Status: AC
  Filled 2015-04-11: qty 20

## 2015-04-11 MED ORDER — HEPARIN SODIUM (PORCINE) 1000 UNIT/ML IJ SOLN
INTRAMUSCULAR | Status: AC
  Filled 2015-04-11: qty 1

## 2015-04-11 MED ORDER — MIDAZOLAM HCL 2 MG/2ML IJ SOLN
INTRAMUSCULAR | Status: AC | PRN
  Administered 2015-04-11 (×2): 1 mg via INTRAVENOUS

## 2015-04-11 MED ORDER — SODIUM CHLORIDE 0.9 % IV SOLN
INTRAVENOUS | Status: DC
  Administered 2015-04-11: 08:00:00 via INTRAVENOUS

## 2015-04-11 MED ORDER — SODIUM CHLORIDE 0.9 % IV SOLN: Freq: Once | INTRAVENOUS | Status: DC

## 2015-04-11 MED ORDER — CLOPIDOGREL BISULFATE 75 MG PO TABS
ORAL_TABLET | ORAL | Status: AC
  Administered 2015-04-11: 75 mg via ORAL
  Filled 2015-04-11: qty 1

## 2015-04-11 MED ORDER — FENTANYL CITRATE (PF) 100 MCG/2ML IJ SOLN
INTRAMUSCULAR | Status: AC | PRN
  Administered 2015-04-11: 50 ug via INTRAVENOUS

## 2015-04-11 NOTE — Discharge Instructions (Signed)

## 2015-04-11 NOTE — Sedation Documentation (Addendum)
L groin exoseal in place- IR tech holding pressure

## 2015-04-11 NOTE — Procedures (Signed)
Interventional Radiology Procedure Note  Procedure: Iliac angiogram.  Left common iliac stent of critical lesion of common iliac artery. 21mm x 6 cm Smart stent Deployment of exoseal device.  Complications: No immediate Recommendations:  - Observe 4 hours. - left leg straight 3 hours, status post exoseal placement - Ok to shower tomorrow - Do not submerge for 7 days - Routine care - antiplatelet therapy   Signed,  Yvone Neu. Loreta Ave, DO

## 2015-04-11 NOTE — Sedation Documentation (Signed)
Distal aorta pressure: systolic 112, MAP (75)

## 2015-04-11 NOTE — Sedation Documentation (Signed)
L external iliac pressure: systolic 125

## 2015-04-11 NOTE — H&P (Signed)
History of Present Illness: Frederick Cisneros. is a 74 y.o. male who was seen in consult with IR on 01/30/15 for c/o worsening buttock, thigh and calves/feet pain with walking that resolved with rest. The patient denies any active tobacco use, quit in 1990 and is pre-DM that is managed with diet. He does have history of CAD last catheterization 2004 with minimal disease-no need for intervention at that time. He has underwent a left carotid endarterectomy in 2006, no recent carotid imaging. Recent CTA imaging revealed B/L lower extremity arterial stenosis. He has been scheduled today for aortogram/LE arteriogram with possible intervention. He does have renal dysfunction-he state he has never been seen by a nephrologist for this. He denies any chest pain, shortness of breath or palpitations. He denies any active signs of bleeding or excessive bruising. He denies any lightheadedness or dizziness. He denies any recent fever or chills. The patient does have OSA and uses a CPAP. He denies any known complications to sedation.   Past Medical History  Diagnosis Date  . Type II or unspecified type diabetes mellitus without mention of complication, not stated as uncontrolled   . Esophageal reflux   . BPH (benign prostatic hyperplasia)   . CAD (coronary artery disease)   . Dyslipidemia   . Hypertension   . LVH (left ventricular hypertrophy)   . Hiatal hernia   . Diverticulitis     Past Surgical History  Procedure Laterality Date  . Cardiac catheterization    . Rotator cuff repair      right  . Transurethral resection of prostate    . Carotid endarterectomy     Allergies: Review of patient's allergies indicates no known allergies.  Medications: Prior to Admission medications   Medication Sig Start Date End Date Taking? Authorizing Provider  amLODipine (NORVASC) 5 MG tablet Take 5 mg by mouth daily.    Yes Historical Provider, MD  aspirin 325 MG tablet Take 325 mg by mouth daily.   Yes  Historical Provider, MD  dexlansoprazole (DEXILANT) 60 MG capsule Take 60 mg by mouth daily.   Yes Historical Provider, MD  ketoconazole (NIZORAL) 2 % cream Apply 1 application topically daily as needed for irritation (itching).  08/08/13  Yes Historical Provider, MD  lisinopril (PRINIVIL,ZESTRIL) 10 MG tablet Take 10 mg by mouth daily.  08/29/13  Yes Historical Provider, MD  metoprolol succinate (TOPROL-XL) 100 MG 24 hr tablet Take 100 mg by mouth daily. Take with or immediately following a meal.   Yes Historical Provider, MD  Multiple Vitamin (MULTIVITAMIN WITH MINERALS) TABS tablet Take 1 tablet by mouth daily.   Yes Historical Provider, MD  OVER THE COUNTER MEDICATION Place 1 drop into both eyes daily as needed (dry eyes/ irritation). Over the counter eye drop   Yes Historical Provider, MD  rosuvastatin (CRESTOR) 40 MG tablet Take 40 mg by mouth daily.   Yes Historical Provider, MD  triamterene-hydrochlorothiazide (MAXZIDE-25) 37.5-25 MG per tablet Take 1 tablet by mouth daily.  09/01/13  Yes Historical Provider, MD  clidinium-chlordiazePOXIDE (LIBRAX) 2.5-5 MG per capsule Take 1 capsule by mouth 2 (two) times daily as needed. Patient not taking: Reported on 04/08/2015 12/13/12   Mardella Layman, MD  omeprazole (PRILOSEC) 40 MG capsule Take 1 capsule (40 mg total) by mouth daily. Patient not taking: Reported on 01/30/2015 03/29/13   Mardella Layman, MD  sucralfate (CARAFATE) 1 GM/10ML suspension One tablespoon after meals and at bedtime Patient not taking: Reported on 04/08/2015 09/26/13  Mardella Layman, MD     Family History  Problem Relation Age of Onset  . Colon cancer Neg Hx   . Breast cancer Sister   . Diabetes Sister     x 3  . GI problems Father     type unknown    History   Social History  . Marital Status: Married    Spouse Name: N/A  . Number of Children: 4  . Years of Education: N/A   Occupational History  . retired    Social History Main Topics  . Smoking  status: Former Smoker    Quit date: 10/19/1988  . Smokeless tobacco: Never Used  . Alcohol Use: Yes     Comment: occasional wine  . Drug Use: No  . Sexual Activity: Not on file   Other Topics Concern  . None   Social History Narrative   Review of Systems: A 12 point ROS discussed and pertinent positives are indicated in the HPI above.  All other systems are negative.  Review of Systems  Vital Signs: BP 161/61 mmHg  Pulse 64  Temp(Src) 97.6 F (36.4 C)  Resp 18  Ht  (1.651 m)  Wt 162 lb (73.483 kg)  BMI 26.96 kg/m2  SpO2 99%  Physical Exam  Constitutional: He is oriented to person, place, and time. No distress.  HENT:  Head: Normocephalic and atraumatic.  Neck: No tracheal deviation present.  Cardiovascular: Normal rate and regular rhythm.  Exam reveals no gallop and no friction rub.   No murmur heard. RCFA pulse 1+, DP/PT not palpable  Pulmonary/Chest: Effort normal and breath sounds normal. No respiratory distress. He has no wheezes. He has no rales.  Abdominal: Soft. Bowel sounds are normal. He exhibits no distension. There is no tenderness.  Neurological: He is alert and oriented to person, place, and time.  Skin: Skin is warm and dry. He is not diaphoretic.   Mallampati Score:  MD Evaluation Airway: WNL Heart: WNL Abdomen: WNL Chest/ Lungs: WNL ASA  Classification: 3 Mallampati/Airway Score: Two  Imaging: No results found.  Labs:  CBC:  Recent Labs  04/11/15 0657  WBC 6.5  HGB 12.4*  HCT 36.6*  PLT 187    COAGS:  Recent Labs  04/11/15 0657  INR 1.04  APTT 28    BMP:  Recent Labs  01/21/15 1230 04/11/15 0657  NA  --  140  K  --  4.0  CL  --  105  CO2  --  26  GLUCOSE  --  115*  BUN 23 22*  CALCIUM  --  9.5  CREATININE 1.57* 1.63*  GFRNONAA 43* 40*  GFRAA 49* 46*    Assessment and Plan: Lower extremity Claudication Peripheral vascular disease, CTA done 01/30/15-reviewed and revealed high grade stenosis of left  common iliac artery and moderate to high grade stenosis of right common iliac artery, left hypogastric artery is occluded.  Patient is scheduled today for aortogram with lower extremity arteriogram with possible angioplasty/stenting The patient has been NPO, on ASA, labs and vitals have been reviewed. Risks and Benefits discussed with the patient including, but not limited to bleeding, infection, vascular injury or contrast induced renal failure. All of the patient's questions were answered, patient is agreeable to proceed. Consent signed and in chart. Renal dysfunction, Cr 1.6 today D/w Dr. Loreta Ave and will hydrate prior to and post procedure Pre-DM HLP on statin PAD s/p left carotid endarterectomy 2006 HTN CAD, last evaluation 2004-2005 OSA-uses CPAP  SignedBerneta Levins 04/11/2015, 9:24 AM

## 2015-04-11 NOTE — Sedation Documentation (Addendum)
L external iliac pressure- systolic 98,MAP (72)

## 2015-04-11 NOTE — Sedation Documentation (Signed)
MD talking to patient.

## 2015-04-11 NOTE — Sedation Documentation (Addendum)
MD placing exoseal to L  groin

## 2015-04-17 ENCOUNTER — Other Ambulatory Visit: Payer: Self-pay | Admitting: Interventional Radiology

## 2015-04-17 DIAGNOSIS — I739 Peripheral vascular disease, unspecified: Secondary | ICD-10-CM

## 2015-04-29 ENCOUNTER — Ambulatory Visit (HOSPITAL_COMMUNITY)
Admission: RE | Admit: 2015-04-29 | Discharge: 2015-04-29 | Disposition: A | Discharge status: Home / Self Care | Payer: Medicare Other | Source: Ambulatory Visit | Attending: Interventional Radiology | Admitting: Interventional Radiology

## 2015-04-29 ENCOUNTER — Encounter (HOSPITAL_COMMUNITY): Payer: Self-pay

## 2015-04-29 ENCOUNTER — Other Ambulatory Visit: Payer: Self-pay | Admitting: Interventional Radiology

## 2015-04-29 DIAGNOSIS — I739 Peripheral vascular disease, unspecified: Secondary | ICD-10-CM

## 2015-04-29 DIAGNOSIS — I774 Celiac artery compression syndrome: Secondary | ICD-10-CM | POA: Insufficient documentation

## 2015-04-29 MED ORDER — IOHEXOL 350 MG/ML SOLN
100.0000 mL | Freq: Once | INTRAVENOUS | Status: AC | PRN
  Administered 2015-04-29: 100 mL via INTRAVENOUS

## 2015-04-30 ENCOUNTER — Ambulatory Visit
Admission: RE | Admit: 2015-04-30 | Discharge: 2015-04-30 | Disposition: A | Discharge status: Home / Self Care | Payer: Medicare Other | Source: Ambulatory Visit | Attending: Interventional Radiology | Admitting: Interventional Radiology

## 2015-04-30 DIAGNOSIS — I739 Peripheral vascular disease, unspecified: Secondary | ICD-10-CM

## 2015-04-30 NOTE — Consult Note (Signed)
Chief Complaint: Chief Complaint  Patient presents with  . Follow-up    Referring Physician(s): Dr. Johny BlamerWilliam Harris  History of Present Illness: Frederick LoopClarence Pavone Jr. is a 74 y.o. 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    Mr Frederick Cisneros underwent left common iliac artery angioplasty and stenting (BMS, 8mm) to address a critical stenosis of the vessel on 6/23/29016.  He was discharged on the same date home for recovery.   Mr Frederick Cisneros reports that his left leg is feeling much better since the treatment, with resolution of symptoms.  He states he recovered well in the first 24 hours with no problems.  [facility] since the treatment, which is one of his goals of treatment.   He reports ongoing burning quality pain in the right buttock region and the right posterior thigh, that will be present on short distance ambulation.  He denies any calf claudication.  He has ongoing "cold" pain of his bilateral feet, and he is in fact treated for neuropathy.   His pulses are palpable in the bilateral dorsalis pedis and posterior tibial.  No skin changes of the lower extremity.  He has been taking 75 mg Plavix by mouth daily since his treatment. This is in addition to his 81 mg aspirin that he takes.   Past Medical History  Diagnosis Date  . Type II or unspecified type diabetes mellitus without mention of complication, not stated as uncontrolled   . Esophageal reflux   . BPH (benign prostatic hyperplasia)   . CAD (coronary artery disease)   . Dyslipidemia   . Hypertension   . LVH (left ventricular hypertrophy)   . Hiatal hernia   . Diverticulitis     Past Surgical History  Procedure Laterality Date  . Cardiac catheterization    . Rotator cuff repair      right  . Transurethral resection of prostate    . Carotid endarterectomy      Allergies: Review of patient's allergies indicates no known  allergies.  Medications: Prior to Admission medications   Medication Sig Start Date End Date Taking? Authorizing Provider  amLODipine (NORVASC) 5 MG tablet Take 5 mg by mouth daily.     Historical Provider, MD  aspirin 325 MG tablet Take 325 mg by mouth daily.    Historical Provider, MD  clidinium-chlordiazePOXIDE (LIBRAX) 2.5-5 MG per capsule Take 1 capsule by mouth 2 (two) times daily as needed. Patient not taking: Reported on 04/08/2015 12/13/12   Mardella Laymanavid R Patterson, MD  clopidogrel (PLAVIX) 75 MG tablet Take 1 tablet (75 mg total) by mouth daily. 04/11/15   Gilmer MorJaime Avantika Shere, DO  dexlansoprazole (DEXILANT) 60 MG capsule Take 60 mg by mouth daily.    Historical Provider, MD  ketoconazole (NIZORAL) 2 % cream Apply 1 application topically daily as needed for irritation (itching).  08/08/13   Historical Provider, MD  lisinopril (PRINIVIL,ZESTRIL) 10 MG tablet Take 10 mg by mouth daily.  08/29/13   Historical Provider, MD  metoprolol succinate (TOPROL-XL) 100 MG 24 hr tablet Take 100 mg by mouth daily. Take with or immediately following a meal.    Historical Provider, MD  Multiple Vitamin (MULTIVITAMIN WITH MINERALS) TABS tablet Take 1 tablet by mouth daily.    Historical Provider, MD  omeprazole (PRILOSEC) 40 MG capsule Take 1 capsule (40 mg total) by mouth daily. Patient not taking: Reported on 01/30/2015 03/29/13   Mardella Laymanavid R Patterson, MD  OVER  THE COUNTER MEDICATION Place 1 drop into both eyes daily as needed (dry eyes/ irritation). Over the counter eye drop    Historical Provider, MD  rosuvastatin (CRESTOR) 40 MG tablet Take 40 mg by mouth daily.    Historical Provider, MD  sucralfate (CARAFATE) 1 GM/10ML suspension One tablespoon after meals and at bedtime Patient not taking: Reported on 04/08/2015 09/26/13   Mardella Layman, MD  triamterene-hydrochlorothiazide (MAXZIDE-25) 37.5-25 MG per tablet Take 1 tablet by mouth daily.  09/01/13   Historical Provider, MD     Family History  Problem Relation  Age of Onset  . Colon cancer Neg Hx   . Breast cancer Sister   . Diabetes Sister     x 3  . GI problems Father     type unknown    History   Social History  . Marital Status: Married    Spouse Name: N/A  . Number of Children: 4  . Years of Education: N/A   Occupational History  . retired    Social History Main Topics  . Smoking status: Former Smoker    Quit date: 10/19/1988  . Smokeless tobacco: Never Used  . Alcohol Use: Yes     Comment: occasional wine  . Drug Use: No  . Sexual Activity: Not on file   Other Topics Concern  . Not on file   Social History Narrative      Review of Systems: A 12 point ROS discussed and pertinent positives are indicated in the HPI above.  All other systems are negative.  Review of Systems  Vital Signs: BP 148/69 mmHg  Pulse 63  Temp(Src) 97.9 F (36.6 C) (Oral)  Resp 14  SpO2 98%  Physical Exam   Targeted physical exam of the feet reveal no skin changes. No ulcers. Palpable dorsalis pedis and posterior tibial pulses.  Imaging:  CTA completed 04/29/2015 -- reveals patent left iliac stent.  Chronic dissection of the aorta again noted. No aneurysm.  Persisting bilateral fem-pop and tibial disease.   Non-invasive 04/30/2015.   Resting ABI of the right and left LE are in the mild range of arterial occlusive disease.  Right = .79.  Left = .78.   Improved waveforms of the left lower extremity after treatment. Waveforms of the right LE are maintained, though not normal.  No deterioration of the right PVR below the knee.      Labs:  CBC:  Recent Labs  04/11/15 0657  WBC 6.5  HGB 12.4*  HCT 36.6*  PLT 187    COAGS:  Recent Labs  04/11/15 0657  INR 1.04  APTT 28    BMP:  Recent Labs  01/21/15 1230 04/11/15 0657  NA  --  140  K  --  4.0  CL  --  105  CO2  --  26  GLUCOSE  --  115*  BUN 23 22*  CALCIUM  --  9.5  CREATININE 1.57* 1.63*  GFRNONAA 43* 40*  GFRAA 49* 46*    LIVER FUNCTION TESTS: No  results for input(s): BILITOT, AST, ALT, ALKPHOS, PROT, ALBUMIN in the last 8760 hours.  TUMOR MARKERS: No results for input(s): AFPTM, CEA, CA199, CHROMGRNA in the last 8760 hours.  Assessment and Plan:  Mr Baby has resolved left lower extremity symptoms, status post left common iliac artery angioplasty and stenting.  Non-invasive exam shows improved doppler waveforms, with ABI in the MILD RANGE of arterial occlusive disease = .78.    Mr Monforte has complaints  of ongoing right buttock and posterior thigh pain.  His CTA shows that the hypogastric artery is patent to the gluteal vessels, and the waveform of the right CFA is adequate.  He has palpable distal pulses, and his right ABI is in the MILD RANGE of arterial occlusive disease = .79.  My impression is that his symptoms of right leg claudication are not related to decreased blood flow.  I discussed this with him, and explained that I do not believe that treating his iliac system of fem-pop system with improve these symptoms, and the risk outweighs the benefit.    He understands my impression.  I have discussed his care with his PCP, Dr. Tiburcio Pea.  He might benefit from an evaluation with a spine-specialist.   At this point, we will see Mr Wogan in 6 months with a follow up non-invasive arterial examination.   Agree with current medical therapy management for his CV co-morbidities.  He will stop the Plavix medication after 1 months, and will continue on 81mg  of ASA.   SignedGilmer Mor 04/30/2015, 8:52 AM   I spent a total of    40 Minutes in face to face in clinical consultation, greater than 50% of which was counseling/coordinating care for PAD, life-style limiting claudication, and status-post left common iliac artery angioplasty & stenting.  Signed,  Yvone Neu. Loreta Ave, DO

## 2015-08-27 ENCOUNTER — Ambulatory Visit
Admission: RE | Admit: 2015-08-27 | Discharge: 2015-08-27 | Disposition: A | Discharge status: Home / Self Care | Payer: Medicare Other | Source: Ambulatory Visit | Attending: Family Medicine | Admitting: Family Medicine

## 2015-08-27 ENCOUNTER — Other Ambulatory Visit: Payer: Self-pay | Admitting: Family Medicine

## 2015-08-27 DIAGNOSIS — M79604 Pain in right leg: Secondary | ICD-10-CM

## 2015-10-07 ENCOUNTER — Other Ambulatory Visit: Payer: Self-pay | Admitting: Interventional Radiology

## 2015-10-07 DIAGNOSIS — I739 Peripheral vascular disease, unspecified: Secondary | ICD-10-CM

## 2015-11-05 ENCOUNTER — Ambulatory Visit
Admission: RE | Admit: 2015-11-05 | Discharge: 2015-11-05 | Disposition: A | Discharge status: Home / Self Care | Payer: Medicare Other | Source: Ambulatory Visit | Attending: Interventional Radiology | Admitting: Interventional Radiology

## 2015-11-05 DIAGNOSIS — I739 Peripheral vascular disease, unspecified: Secondary | ICD-10-CM

## 2015-11-05 NOTE — Progress Notes (Signed)
Chief Complaint: Left LE claudication.  Referring Physician(s): Dr Johny Blamer  History of Present Illness: Frederick Cisneros. is a 75 y.o. male presenting today as a scheduled follow-up approximately 6 months status post left iliac artery angioplasty/stenting for left lower extremity claudication symptoms.  He reports that he is quite satisfied with his resolution in symptoms, and he is able to complete all of his usual daily activities without symptoms. He does report some low back and perhaps buttocks pain, which may be attributable to hypogastric disease. 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  He denies any left lower extremity claudication or rest pain at this time, and he continues to go to the [facility] for exercise freely.  Noninvasive examination performed today demonstrates his left ABI 0.86 which is in the mild range of arterial occlusive disease. This is an improvement from March 2016 of 0.68. Doppler waveforms are significantly improved after treatment. 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  He continues to be on medical therapy with a full dose aspirin antiplatelet, blood pressure medication, and Crestor.  Past Medical History  Diagnosis Date  . Type II or unspecified type diabetes mellitus without mention of complication, not stated as uncontrolled   . Esophageal reflux   . BPH (benign prostatic hyperplasia)   . CAD (coronary artery disease)   . Dyslipidemia   . Hypertension   . LVH (left ventricular hypertrophy)   . Hiatal hernia   . Diverticulitis     Past Surgical History  Procedure Laterality Date  . Cardiac catheterization    .  Rotator cuff repair      right  . Transurethral resection of prostate    . Carotid endarterectomy      Allergies: Review of patient's allergies indicates no known allergies.  Medications: Prior to Admission medications   Medication Sig Start Date End Date Taking? Authorizing Provider  amLODipine (NORVASC) 5 MG tablet Take 5 mg by mouth daily.    Yes Historical Provider, MD  aspirin 325 MG tablet Take 325 mg by mouth daily.   Yes Historical Provider, MD  clidinium-chlordiazePOXIDE (LIBRAX) 2.5-5 MG per capsule Take 1 capsule by mouth 2 (two) times daily as needed. 12/13/12  Yes Mardella Layman, MD  dexlansoprazole (DEXILANT) 60 MG capsule Take 60 mg by mouth daily.   Yes Historical Provider, MD  ketoconazole (NIZORAL) 2 % cream Apply 1 application topically daily as needed for irritation (itching). Reported on 11/05/2015 08/08/13  Yes Historical Provider, MD  lisinopril (PRINIVIL,ZESTRIL) 10 MG tablet Take 10 mg by mouth daily.  08/29/13  Yes Historical Provider, MD  metoprolol succinate (TOPROL-XL) 100 MG 24 hr tablet Take 100 mg by mouth daily. Take with or immediately following a meal.   Yes Historical Provider, MD  Multiple Vitamin (MULTIVITAMIN WITH MINERALS) TABS tablet Take 1 tablet by mouth daily.   Yes Historical Provider, MD  OVER THE COUNTER MEDICATION Place 1 drop into both eyes daily as needed (dry eyes/ irritation). Over the counter eye drop   Yes Historical Provider, MD  clopidogrel (PLAVIX) 75 MG tablet Take 1 tablet (75 mg total) by mouth daily. Patient not taking: Reported on 11/05/2015 04/11/15   [name], DO  omeprazole (PRILOSEC)  40 MG capsule Take 1 capsule (40 mg total) by mouth daily. Patient not taking: Reported on 11/05/2015 03/29/13   Mardella Layman, MD  rosuvastatin (CRESTOR) 40 MG tablet Take 40 mg by mouth daily. Reported on 11/05/2015    Historical Provider, MD  sucralfate (CARAFATE) 1 GM/10ML suspension One tablespoon after meals and at bedtime Patient not  taking: Reported on 04/08/2015 09/26/13   Mardella Layman, MD  triamterene-hydrochlorothiazide (MAXZIDE-25) 37.5-25 MG per tablet Take 1 tablet by mouth daily. Reported on 11/05/2015 09/01/13   Historical Provider, MD     Family History  Problem Relation Age of Onset  . Colon cancer Neg Hx   . Breast cancer Sister   . Diabetes Sister     x 3  . GI problems Father     type unknown    Social History   Social History  . Marital Status: Married    Spouse Name: N/A  . Number of Children: 4  . Years of Education: N/A   Occupational History  . retired    Social History Main Topics  . Smoking status: Former Smoker    Quit date: 10/19/1988  . Smokeless tobacco: Never Used  . Alcohol Use: Yes     Comment: occasional wine  . Drug Use: No  . Sexual Activity: Not on file   Other Topics Concern  . Not on file   Social History Narrative       Review of Systems: A 12 point ROS discussed and pertinent positives are indicated in the HPI above.  All other systems are negative.  Review of Systems  Vital Signs: BP 146/63 mmHg  Pulse 71  Temp(Src) 97.8 F (36.6 C) (Oral)  Resp 14  Ht 5' 5.5" (1.664 m)  Wt 163 lb (73.936 kg)  BMI 26.70 kg/m2  SpO2 98%  Physical Exam    Imaging: No results found.  Labs:  CBC:  Recent Labs  04/11/15 0657  WBC 6.5  HGB 12.4*  HCT 36.6*  PLT 187    COAGS:  Recent Labs  04/11/15 0657  INR 1.04  APTT 28    BMP:  Recent Labs  01/21/15 1230 04/11/15 0657  NA  --  140  K  --  4.0  CL  --  105  CO2  --  26  GLUCOSE  --  115*  BUN 23 22*  CALCIUM  --  9.5  CREATININE 1.57* 1.63*  GFRNONAA 43* 40*  GFRAA 49* 46*    LIVER FUNCTION TESTS: No results for input(s): BILITOT, AST, ALT, ALKPHOS, PROT, ALBUMIN in the last 8760 hours.  TUMOR MARKERS: No results for input(s): AFPTM, CEA, CA199, CHROMGRNA in the last 8760 hours.  Assessment and Plan:  [name] is a 75 year old gentleman with known vascular disease and  left lower extremity claudication, resolved after his left common iliac artery bare metal stenting performed 04/11/2015. He seems quite pleased with his results after our treatment. He continues to go to the [facility] for exercise. He is currently on maximal medical therapy for his comorbidities.  I did discuss with him the implications of vascular disease and the natural history including progression. I discussed with him specifically the findings of his abdominal aorta and of his lower extremity noninvasive examinations. Given that he is essentially asymptomatic at this time I believe we can safely follow his imaging findings.  I encouraged him to continue his current medical therapy, and also his frequent exercise, and we will see him as  a scheduled follow-up in 6 months with repeat noninvasive arterial exam. He understands he can call us in the interval if anything changes.   Electronically Signed: [name] 11/05/2015, 1:55 PM   I spent a total of    15 Minutes in face to face in clinical consultation, greater than 50% of which was counseling/coordinating care for PAD, prior left iliac artery bare metal stenting.

## 2016-04-08 ENCOUNTER — Other Ambulatory Visit: Payer: Self-pay | Admitting: Interventional Radiology

## 2016-04-08 DIAGNOSIS — I739 Peripheral vascular disease, unspecified: Secondary | ICD-10-CM

## 2016-04-29 ENCOUNTER — Ambulatory Visit
Admission: RE | Admit: 2016-04-29 | Discharge: 2016-04-29 | Disposition: A | Discharge status: Home / Self Care | Payer: Medicare Other | Source: Ambulatory Visit | Attending: Interventional Radiology | Admitting: Interventional Radiology

## 2016-04-29 DIAGNOSIS — I739 Peripheral vascular disease, unspecified: Secondary | ICD-10-CM

## 2016-04-29 HISTORY — PX: IR GENERIC HISTORICAL: IMG1180011

## 2016-04-29 NOTE — Progress Notes (Signed)
Chief Complaint: Arterial Disease   History of Present Illness: Frederick Cisneros. is a 75 y.o. male presenting today to [facility] clinic as a scheduled follow up appointment.  He has had a prior left common iliac artery stent placed, 04/11/2015 for claudication.    He returns today with no complaints.  He has been doing very well over the past year, and has been able to return to all of his ADL's without any problems.  Since his procedure, he denies any recurrent claudication.  He is satisfied to be able to continue working out successfully without any restrictions.      Past Medical History  Diagnosis Date  . Type II or unspecified type diabetes mellitus without mention of complication, not stated as uncontrolled   . Esophageal reflux   . BPH (benign prostatic hyperplasia)   . CAD (coronary artery disease)   . Dyslipidemia   . Hypertension   . LVH (left ventricular hypertrophy)   . Hiatal hernia   . Diverticulitis     Past Surgical History  Procedure Laterality Date  . Cardiac catheterization    . Rotator cuff repair      right  . Transurethral resection of prostate    . Carotid endarterectomy      Allergies: Review of patient's allergies indicates no known allergies.  Medications: Prior to Admission medications   Medication Sig Start Date End Date Taking? Authorizing Provider  amLODipine (NORVASC) 5 MG tablet Take 5 mg by mouth daily.    Yes Historical Provider, MD  clidinium-chlordiazePOXIDE (LIBRAX) 2.5-5 MG per capsule Take 1 capsule by mouth 2 (two) times daily as needed. 12/13/12  Yes Mardella Layman, MD  dexlansoprazole (DEXILANT) 60 MG capsule Take 60 mg by mouth daily.   Yes Historical Provider, MD  ketoconazole (NIZORAL) 2 % cream Apply 1 application topically daily as needed for irritation (itching). Reported on 11/05/2015 08/08/13  Yes Historical Provider, MD  lisinopril (PRINIVIL,ZESTRIL) 10 MG tablet Take 10 mg by mouth daily.  08/29/13  Yes Historical  Provider, MD  metoprolol succinate (TOPROL-XL) 100 MG 24 hr tablet Take 100 mg by mouth daily. Take with or immediately following a meal.   Yes Historical Provider, MD  Multiple Vitamin (MULTIVITAMIN WITH MINERALS) TABS tablet Take 1 tablet by mouth daily.   Yes Historical Provider, MD  omeprazole (PRILOSEC) 40 MG capsule Take 1 capsule (40 mg total) by mouth daily. 03/29/13  Yes Mardella Layman, MD  OVER THE COUNTER MEDICATION Place 1 drop into both eyes daily as needed (dry eyes/ irritation). Over the counter eye drop   Yes Historical Provider, MD  rosuvastatin (CRESTOR) 40 MG tablet Take 40 mg by mouth daily. Reported on 11/05/2015   Yes Historical Provider, MD  triamterene-hydrochlorothiazide (MAXZIDE-25) 37.5-25 MG per tablet Take 1 tablet by mouth daily. Reported on 11/05/2015 09/01/13  Yes Historical Provider, MD  aspirin 325 MG tablet Take 325 mg by mouth daily.    Historical Provider, MD  clopidogrel (PLAVIX) 75 MG tablet Take 1 tablet (75 mg total) by mouth daily. Patient not taking: Reported on 04/29/2016 04/11/15   [name], DO  sucralfate (CARAFATE) 1 GM/10ML suspension One tablespoon after meals and at bedtime Patient not taking: Reported on 04/08/2015 09/26/13   Mardella Layman, MD     Family History  Problem Relation Age of Onset  . Colon cancer Neg Hx   . Breast cancer Sister   . Diabetes Sister     x 3  .  GI problems Father     type unknown    Social History   Social History  . Marital Status: Married    Spouse Name: N/A  . Number of Children: 4  . Years of Education: N/A   Occupational History  . retired    Social History Main Topics  . Smoking status: Former Smoker    Quit date: 10/19/1988  . Smokeless tobacco: Never Used  . Alcohol Use: Yes     Comment: occasional wine  . Drug Use: No  . Sexual Activity: Not on file   Other Topics Concern  . Not on file   Social History Narrative    Review of Systems: A 12 point ROS discussed and pertinent  positives are indicated in the HPI above.  All other systems are negative.  Review of Systems  Vital Signs: BP 154/71 mmHg  Pulse 64  Temp(Src) 97.5 F (36.4 C) (Oral)  Resp 14  Ht 5' 5.5" (1.664 m)  Wt 164 lb (74.39 kg)  BMI 26.87 kg/m2  SpO2 98%  Physical Exam  Warm bilateral lower extremity, without any wounds.  No swelling or discoloration.  Imaging: US Arterial Seg Multiple  04/29/2016  CLINICAL DATA:  Left iliac artery stent EXAM: NONINVASIVE PHYSIOLOGIC VASCULAR STUDY OF BILATERAL LOWER EXTREMITIES TECHNIQUE: Evaluation of both lower extremities was performed at rest, including calculation of ankle-brachial indices, multiple segmental pressure evaluation, segmental Doppler and segmental pulse volume recording. COMPARISON:  11/05/2015 FINDINGS: Right ABI:  0.77 Left ABI:  0.81 Right Lower Extremity: The right femoral Doppler waveform is triphasic. The popliteal, posterior tibial, and dorsalis pedis are biphasic. Left Lower Extremity: The left femoral waveform is biphasic. Left popliteal is biphasic. The posterior tibial is triphasic. The dorsalis pedis is biphasic. IMPRESSION: Arterial flow in the left lower extremity is not significantly changed. There is mild disease extending to the dorsalis pedis artery. The left ankle-brachial index is not significantly changed at 0.81. The posterior tibial waveform is triphasic. Femoral and popliteal waveforms are somewhat dampened of unknown significance There is stable mild diffuse arterial occlusive disease within the right popliteal and tibial arterial system. Electronically Signed   By: Jolaine Click M.D.   On: 04/29/2016 14:40    Labs:  CBC: No results for input(s): WBC, HGB, HCT, PLT in the last 8760 hours.  COAGS: No results for input(s): INR, APTT in the last 8760 hours.  BMP: No results for input(s): NA, K, CL, CO2, GLUCOSE, BUN, CALCIUM, CREATININE, GFRNONAA, GFRAA in the last 8760 hours.  Invalid input(s): CMP  LIVER  FUNCTION TESTS: No results for input(s): BILITOT, AST, ALT, ALKPHOS, PROT, ALBUMIN in the last 8760 hours.  TUMOR MARKERS: No results for input(s): AFPTM, CEA, CA199, CHROMGRNA in the last 8760 hours.  Assessment and Plan:  75 year old male with a history of left leg claudication, SP CIA bare-metal stent 04/11/2015.  He has not had recurrent symptoms.    I have encouraged Mr Tuzzolino to continue to observe all of his physicians appointments.  I have encouraged him to continue anti-platelet medication, and for iliac artery stent,  ASA is reasonable.  I agree with his other medications to reduce CV risk including anti-lipid meds and anti-HTN meds.    Given his DM and age, I would advise continued lower extremity ABI/duplex surveillance, and every 6 months is reasonable given that we have treated his iliac artery.    We will schedule him for a repeat non-invasive exam in 6 months.  Unless  he need to have an office appointment at that time, we will wait to schedule his next office appointment until 1 year from today in 2018, at which time we will plan on another ABI/duplex arterial exam.     Electronically Signed: Gilmer MorWAGNER, Charish Schroepfer 04/29/2016, 4:03 PM   I spent a total of    25 Minutes in face to face in clinical consultation, greater than 50% of which was counseling/coordinating care for arterial vascular disease, claudication, SP left iliac artery stenting.

## 2016-07-17 ENCOUNTER — Encounter: Payer: Self-pay | Admitting: Interventional Radiology

## 2016-12-02 ENCOUNTER — Other Ambulatory Visit: Payer: Self-pay | Admitting: Interventional Radiology

## 2016-12-02 DIAGNOSIS — I739 Peripheral vascular disease, unspecified: Secondary | ICD-10-CM

## 2016-12-22 ENCOUNTER — Ambulatory Visit
Admission: RE | Admit: 2016-12-22 | Discharge: 2016-12-22 | Disposition: A | Discharge status: Home / Self Care | Payer: Medicare Other | Source: Ambulatory Visit | Attending: Interventional Radiology | Admitting: Interventional Radiology

## 2016-12-22 DIAGNOSIS — I739 Peripheral vascular disease, unspecified: Secondary | ICD-10-CM

## 2016-12-22 HISTORY — PX: IR RADIOLOGIST EVAL & MGMT: IMG5224

## 2016-12-22 NOTE — Progress Notes (Signed)
Chief Complaint:  Peripheral arterial disease  Referring Physician(s): Dr. Johny Blamer  History of Present Illness: Frederick Cisneros. is a 76 y.o. male presenting [facility] as a scheduled follow-up for his peripheral arterial disease, SP treatment of Rutherford 3 class symptoms with prior stenting of a flow-limiting lesion of the left iliac artery, 04/11/2015.    He arrives today with no new complaints.  In fact, he says he has been very satisfied with his ability to continue to participate with his work-outs at the [facility].  He enjoys walking on the treadmill, which he is able to do for 30 minutes at at time without symptoms of his lower extremity of buttock region.   Contrarily, he tells me that he occasionally has back pain in the lower back extending around his flank to the front.  This pain will be present with activities such as yard work.  Most recently yesterday he tells me he was using a weed-eater, which aggravated his back so that he had to rest.  He also tells me that sometimes when he is walking in the community, such as at a [employer] football game last fall, his low back will hurt.    He continues to take ASA 81mg  daily.  He takes medications for BP control, and he takes crestor.  He tells me he has a diagnosis of "pre-diabetes" which is not being treated specifically.   He has had repeat non-invasive exam performed, which is essentially unchanged from his prior non-invasive study from July of 2017.   ABI are in the mild range of severity bilateral, unchanged.  The remainder of the exam shows evidence of bilateral developing femoral-popliteal disease, with no evidence of significant aorto-iliac/iliac disease.    Past Medical History:  Diagnosis Date  . BPH (benign prostatic hyperplasia)   . CAD (coronary artery disease)   . Diverticulitis   . Dyslipidemia   . Esophageal reflux   . Hiatal hernia   . Hypertension     . LVH (left ventricular hypertrophy)   . Type II or unspecified type diabetes mellitus without mention of complication, not stated as uncontrolled     Past Surgical History:  Procedure Laterality Date  . CARDIAC CATHETERIZATION    . CAROTID ENDARTERECTOMY    . IR GENERIC HISTORICAL  04/29/2016   IR RADIOLOGIST EVAL & MGMT 04/29/2016 [name], DO [facility] INTERV RAD  . ROTATOR CUFF REPAIR     right  . TRANSURETHRAL RESECTION OF PROSTATE      Allergies: Patient has no known allergies.  Medications: Prior to Admission medications   Medication Sig Start Date End Date Taking? Authorizing Provider  amLODipine (NORVASC) 5 MG tablet Take 5 mg by mouth daily.    Yes Historical Provider, MD  aspirin 325 MG tablet Take 325 mg by mouth daily.   Yes Historical Provider, MD  clidinium-chlordiazePOXIDE (LIBRAX) 2.5-5 MG per capsule Take 1 capsule by mouth 2 (two) times daily as needed. 12/13/12  Yes Mardella Layman, MD  dexlansoprazole (DEXILANT) 60 MG capsule Take 60 mg by mouth daily.   Yes Historical Provider, MD  ketoconazole (NIZORAL) 2 % cream Apply 1 application topically daily as needed for irritation (itching). Reported on 11/05/2015 08/08/13  Yes Historical Provider, MD  lisinopril (PRINIVIL,ZESTRIL) 10 MG tablet Take 10 mg by mouth daily.  08/29/13  Yes Historical Provider, MD  metoprolol succinate (TOPROL-XL) 100 MG 24 hr tablet Take 100 mg by mouth daily. Take  with or immediately following a meal.   Yes Historical Provider, MD  Multiple Vitamin (MULTIVITAMIN WITH MINERALS) TABS tablet Take 1 tablet by mouth daily.   Yes Historical Provider, MD  OVER THE COUNTER MEDICATION Place 1 drop into both eyes daily as needed (dry eyes/ irritation). Over the counter eye drop   Yes Historical Provider, MD  rosuvastatin (CRESTOR) 40 MG tablet Take 40 mg by mouth daily. Reported on 11/05/2015   Yes Historical Provider, MD  triamterene-hydrochlorothiazide (MAXZIDE-25) 37.5-25 MG per tablet Take 1  tablet by mouth daily. Reported on 11/05/2015 09/01/13  Yes Historical Provider, MD  clopidogrel (PLAVIX) 75 MG tablet Take 1 tablet (75 mg total) by mouth daily. Patient not taking: Reported on 04/29/2016 04/11/15   [name], DO  omeprazole (PRILOSEC) 40 MG capsule Take 1 capsule (40 mg total) by mouth daily. Patient not taking: Reported on 12/22/2016 03/29/13   Mardella Layman, MD  sucralfate (CARAFATE) 1 GM/10ML suspension One tablespoon after meals and at bedtime Patient not taking: Reported on 04/08/2015 09/26/13   Mardella Layman, MD     Family History  Problem Relation Age of Onset  . Colon cancer Neg Hx   . Breast cancer Sister   . Diabetes Sister     x 3  . GI problems Father     type unknown    Social History   Social History  . Marital status: Married    Spouse name: N/A  . Number of children: 4  . Years of education: N/A   Occupational History  . retired    Social History Main Topics  . Smoking status: Former Smoker    Quit date: 10/19/1988  . Smokeless tobacco: Never Used  . Alcohol use Yes     Comment: occasional wine  . Drug use: No  . Sexual activity: Not on file   Other Topics Concern  . Not on file   Social History Narrative  . No narrative on file       Review of Systems: A 12 point ROS discussed and pertinent positives are indicated in the HPI above.  All other systems are negative.  Review of Systems  Vital Signs: BP (!) 157/71 (BP Location: Left Arm, Patient Position: Sitting, Cuff Size: Normal)   Pulse 61   Temp 97.5 F (36.4 C) (Oral)   Resp 14   Ht 5' 5.5" (1.664 m)   Wt 163 lb (73.9 kg)   SpO2 99%   BMI 26.71 kg/m   Physical Exam  Targeted exam: Palpable DP/PT pulses bilateral feet. No wounds, including the inter-triginous areas bilateral.   Imaging: No results found.  Labs:  CBC: No results for input(s): WBC, HGB, HCT, PLT in the last 8760 hours.  COAGS: No results for input(s): INR, APTT in the last 8760  hours.  BMP: No results for input(s): NA, K, CL, CO2, GLUCOSE, BUN, CALCIUM, CREATININE, GFRNONAA, GFRAA in the last 8760 hours.  Invalid input(s): CMP  LIVER FUNCTION TESTS: No results for input(s): BILITOT, AST, ALT, ALKPHOS, PROT, ALBUMIN in the last 8760 hours.  TUMOR MARKERS: No results for input(s): AFPTM, CEA, CA199, CHROMGRNA in the last 8760 hours.  Assessment and Plan:  Mr Volkman is a 76 year old male with a history of PAD and Rutherford 3 class symptoms of claudication, previously treated, SP CIA bare-metal stent 04/11/2015.      His non-invasive examination remains unchanged, and he denies any new symptoms that are relatable to vascular disease.  In  fact, he continues [facility] without symptoms.  He does say that he has low back pain/hip pain when he is working in the yard and sometimes walking, which I am not confident is vascular disease.  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    At this time I think surveillance is most appropriate, with continued maximal medical therapy.   Also, I asked if he has had routine podiatric care, given his status with "pre-diabetes".  He does not have established podiatric care, so I offered him the name of [facility] as a potential source for establishing care.    I have encouraged Mr [name] to continue to observe all of his physicians appointments.  I have encouraged him to continue anti-platelet medication and his other medications for risk-reduction.     Given his DM and age, I would advise continued lower extremity ABI/duplex surveillance.  I think annual surveillance is reasonable at this point, with next ABI and visit in 12 months.      Electronically Signed: Gilmer MorWAGNER, Falecia Vannatter 12/22/2016, 11:19 AM   I spent a total of   15 Minutes in face to face in clinical consultation, greater  than 50% of which was counseling/coordinating care for PAD, SP left iliac artery stent.

## 2017-04-05 ENCOUNTER — Encounter: Payer: Self-pay | Admitting: Interventional Radiology

## 2017-11-09 ENCOUNTER — Other Ambulatory Visit: Payer: Self-pay | Admitting: Interventional Radiology

## 2017-11-09 DIAGNOSIS — I739 Peripheral vascular disease, unspecified: Secondary | ICD-10-CM

## 2017-11-24 ENCOUNTER — Other Ambulatory Visit: Payer: Self-pay | Admitting: Family Medicine

## 2017-11-24 DIAGNOSIS — R1084 Generalized abdominal pain: Secondary | ICD-10-CM

## 2017-12-01 ENCOUNTER — Other Ambulatory Visit: Payer: Medicare Other

## 2017-12-03 ENCOUNTER — Ambulatory Visit
Admission: RE | Admit: 2017-12-03 | Discharge: 2017-12-03 | Disposition: A | Discharge status: Home / Self Care | Payer: Medicare Other | Source: Ambulatory Visit | Attending: Family Medicine | Admitting: Family Medicine

## 2017-12-03 DIAGNOSIS — R1084 Generalized abdominal pain: Secondary | ICD-10-CM

## 2017-12-03 MED ORDER — IOPAMIDOL (ISOVUE-300) INJECTION 61%
100.0000 mL | Freq: Once | INTRAVENOUS | Status: AC | PRN
  Administered 2017-12-03: 100 mL via INTRAVENOUS

## 2017-12-23 ENCOUNTER — Ambulatory Visit
Admission: RE | Admit: 2017-12-23 | Discharge: 2017-12-23 | Disposition: A | Discharge status: Home / Self Care | Payer: Medicare Other | Source: Ambulatory Visit | Attending: Interventional Radiology | Admitting: Interventional Radiology

## 2017-12-23 ENCOUNTER — Encounter: Payer: Self-pay | Admitting: *Deleted

## 2017-12-23 DIAGNOSIS — I739 Peripheral vascular disease, unspecified: Secondary | ICD-10-CM

## 2017-12-23 HISTORY — PX: IR RADIOLOGIST EVAL & MGMT: IMG5224

## 2017-12-23 NOTE — Progress Notes (Signed)
Chief Complaint: PAD  Referring Physician(s): Dr. Johny BlamerWilliam Cisneros  History of Present Illness: Frederick LoopClarence Maiorana Jr. is a 77 y.o. male presenting today to [facility] as a scheduled follow-up for his peripheral arterial disease, SP treatment of Rutherford 3 class symptoms with prior stenting of a flow-limiting lesion of the left iliac artery, 04/11/2015.    He arrives today with no new complaints.  He continues to enjoy his work-outs at [employer], and in fact says he was working out yesterday.  He enjoys walking on the treadmill, which he is able to do for 30 minutes at at time without symptoms of his lower extremity of buttock region.   He reports that he had a few episodes of hip/buttock pain over the past year, but this has "gone away" completely.  He denies any calf or leg cramping.   He continues to take ASA 81mg . BP medication, and crestor.   He has had repeat non-invasive exam performed today, which is essentially unchanged from his prior non-invasive study from July of 2017.   ABI are in the mild range of severity bilateral, unchanged.  The remainder of the exam shows evidence of bilateral developing femoral-popliteal disease, with no evidence of significant aorto-iliac/iliac disease.     Past Medical History:  Diagnosis Date  . BPH (benign prostatic hyperplasia)   . CAD (coronary artery disease)   . Diverticulitis   . Dyslipidemia   . Esophageal reflux   . Hiatal hernia   . Hypertension   . LVH (left ventricular hypertrophy)   . Type II or unspecified type diabetes mellitus without mention of complication, not stated as uncontrolled     Past Surgical History:  Procedure Laterality Date  . CARDIAC CATHETERIZATION    . CAROTID ENDARTERECTOMY    . IR GENERIC HISTORICAL  04/29/2016   IR RADIOLOGIST EVAL & MGMT 04/29/2016 Gilmer MorJaime Plato Alspaugh, DO [facility] INTERV RAD  . IR RADIOLOGIST EVAL & MGMT  12/22/2016  . IR RADIOLOGIST EVAL & MGMT  12/23/2017    . ROTATOR CUFF REPAIR     right  . TRANSURETHRAL RESECTION OF PROSTATE      Allergies: Patient has no known allergies.  Medications: Prior to Admission medications   Medication Sig Start Date End Date Taking? Authorizing Provider  amLODipine (NORVASC) 5 MG tablet Take 5 mg by mouth daily.    Yes [provider]  aspirin 325 MG tablet Take 325 mg by mouth daily.   Yes [provider]  clidinium-chlordiazePOXIDE (LIBRAX) 2.5-5 MG per capsule Take 1 capsule by mouth 2 (two) times daily as needed. 12/13/12  Yes Mardella LaymanPatterson, David R, MD  ketoconazole (NIZORAL) 2 % cream Apply 1 application topically daily as needed for irritation (itching). Reported on 11/05/2015 08/08/13  Yes [provider]  lisinopril (PRINIVIL,ZESTRIL) 10 MG tablet Take 10 mg by mouth daily.  08/29/13  Yes [provider]  metoprolol succinate (TOPROL-XL) 100 MG 24 hr tablet Take 100 mg by mouth daily. Take with or immediately following a meal.   Yes [provider]  Multiple Vitamin (MULTIVITAMIN WITH MINERALS) TABS tablet Take 1 tablet by mouth daily.   Yes [provider]  rosuvastatin (CRESTOR) 40 MG tablet Take 40 mg by mouth daily. Reported on 11/05/2015   Yes [provider]  clopidogrel (PLAVIX) 75 MG tablet Take 1 tablet (75 mg total) by mouth daily. Patient not taking: Reported on 12/23/2017 04/11/15   Gilmer MorWagner, Tesneem Dufrane, DO  dexlansoprazole (DEXILANT) 60 MG  capsule Take 60 mg by mouth daily.    [provider]  omeprazole (PRILOSEC) 40 MG capsule Take 1 capsule (40 mg total) by mouth daily. Patient not taking: Reported on 12/23/2017 03/29/13   Mardella Layman, MD  OVER THE COUNTER MEDICATION Place 1 drop into both eyes daily as needed (dry eyes/ irritation). Over the counter eye drop    [provider]  sucralfate (CARAFATE) 1 GM/10ML suspension One tablespoon after meals and at bedtime Patient not taking: Reported on 04/08/2015 09/26/13    Mardella Layman, MD  triamterene-hydrochlorothiazide (MAXZIDE-25) 37.5-25 MG per tablet Take 1 tablet by mouth daily. Reported on 11/05/2015 09/01/13   [provider]     Family History  Problem Relation Age of Onset  . Colon cancer Neg Hx   . Breast cancer Sister   . Diabetes Sister        x 3  . GI problems Father        type unknown    Social History   Socioeconomic History  . Marital status: Married    Spouse name: Not on file  . Number of children: 4  . Years of education: Not on file  . Highest education level: Not on file  Social Needs  . Financial resource strain: Not on file  . Food insecurity - worry: Not on file  . Food insecurity - inability: Not on file  . Transportation needs - medical: Not on file  . Transportation needs - non-medical: Not on file  Occupational History  . Occupation: retired  Tobacco Use  . Smoking status: Former Smoker    Last attempt to quit: 10/19/1988    Years since quitting: 29.1  . Smokeless tobacco: Never Used  Substance and Sexual Activity  . Alcohol use: Yes    Comment: occasional wine  . Drug use: No  . Sexual activity: Not on file  Other Topics Concern  . Not on file  Social History Narrative  . Not on file    Review of Systems: A 12 point ROS discussed and pertinent positives are indicated in the HPI above.  All other systems are negative.  Review of Systems  Vital Signs: BP (!) 162/85   Pulse 68   Temp (!) 97.4 F (36.3 C) (Oral)   Resp 14   Ht 5' 5.5" (1.664 m)   Wt 168 lb (76.2 kg)   SpO2 98%   BMI 27.53 kg/m   Physical Exam  Mallampati Score:  2  Imaging: Ct Abdomen Pelvis W Contrast  Result Date: 12/06/2017 CLINICAL DATA:  Generalized abdominal pain with bloating and gas. EXAM: CT ABDOMEN AND PELVIS WITH CONTRAST TECHNIQUE: Multidetector CT imaging of the abdomen and pelvis was performed using the standard protocol following bolus administration of intravenous contrast. CONTRAST:   ISOVUE-300 IOPAMIDOL (ISOVUE-300) INJECTION 61% COMPARISON:  CTA examination dated 04/29/2015 FINDINGS: Lower chest: Lung bases are clear. Calcifications at the aortic root. Hepatobiliary: Normal appearance of the liver, gallbladder and portal venous system. Pancreas: Normal appearance of the pancreas without inflammation or duct dilatation. Spleen: Normal appearance of spleen without enlargement. Adrenals/Urinary Tract: Normal adrenal glands. 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 Normal appearance the left kidney without hydronephrosis. No suspicious renal lesions. Right inguinal hernia containing a portion of the anterior urinary bladder. 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 Stomach/Bowel: Few diverticula involving the sigmoid colon and descending colon. No acute inflammatory changes in the bowel. Oral contrast in the small  and large bowel. Normal appendix. Vascular/Lymphatic: Again noted is severe atherosclerotic disease in the infrarenal abdominal aorta without aneurysm. Main visceral arteries are patent but limited evaluation for stenosis due to the phase of contrast. However, there is probably narrowing at the origin of the celiac trunk related to median arcuate ligament compression. Again noted is a stent in the left external and left common iliac arteries. Left iliac stent appears to be patent. Severe atherosclerotic disease in the right common iliac artery. Proximal femoral arteries are patent with atherosclerotic disease in common femoral arteries. Venous structures are unremarkable. No lymph node enlargement in the abdomen or pelvis. Reproductive: Prostate enlargement measuring 5.9 x 3.8 x 4.4 cm. Other: Large right inguinal hernia containing fat and portion of the anterior urinary bladder. No significant inflammatory changes associated with this hernia. Musculoskeletal: No acute bone abnormality. IMPRESSION: Right inguinal hernia  containing a portion of the urinary bladder. Severe atherosclerotic disease in the aorta and iliac arteries. Left iliac artery stent appears to be patent. Prostate enlargement. Electronically Signed   By: Richarda Overlie M.D.   On: 12/06/2017 08:34   Ir Radiologist Eval & Mgmt  Result Date: 12/23/2017 Please refer to notes tab for details about interventional procedure. (Op Note)   Labs:  CBC: No results for input(s): WBC, HGB, HCT, PLT in the last 8760 hours.  COAGS: No results for input(s): INR, APTT in the last 8760 hours.  BMP: No results for input(s): NA, K, CL, CO2, GLUCOSE, BUN, CALCIUM, CREATININE, GFRNONAA, GFRAA in the last 8760 hours.  Invalid input(s): CMP  LIVER FUNCTION TESTS: No results for input(s): BILITOT, AST, ALT, ALKPHOS, PROT, ALBUMIN in the last 8760 hours.  TUMOR MARKERS: No results for input(s): AFPTM, CEA, CA199, CHROMGRNA in the last 8760 hours.  Assessment and Plan:  Mr Spainhour is 77 year old with a history of PAD and claudication, now resolved after prior left iliac artery bare metal stenting, 04/11/2015.    His non-invasive exam is essentially unchanged, and he remains symptom free.    We discussed surveillance as the most appropriate strategy at this point, with a 1 year follow up planned.  Continue maximal medical therapy.   Plan: - 1 year follow up visit with non-invasive exam. - Continue maximal medical therapy - I have advised him to observe all of his future doctors appointments.   Electronically Signed: [name] 12/23/2017, 2:31 PM   I spent a total of    15 Minutes in face to face in clinical consultation, greater than 50% of which was counseling/coordinating care for PAD, SP left iliac artery stenting for claudication

## 2018-03-15 ENCOUNTER — Encounter: Payer: Self-pay | Admitting: Gastroenterology

## 2018-03-29 ENCOUNTER — Encounter: Payer: Self-pay | Admitting: Gastroenterology

## 2018-03-29 ENCOUNTER — Ambulatory Visit: Payer: Medicare Other | Admitting: Gastroenterology

## 2018-03-29 VITALS — BP 146/62 | HR 65 | Ht 65.0 in | Wt 168.0 lb

## 2018-03-29 DIAGNOSIS — R140 Abdominal distension (gaseous): Secondary | ICD-10-CM

## 2018-03-29 DIAGNOSIS — R1030 Lower abdominal pain, unspecified: Secondary | ICD-10-CM

## 2018-03-29 DIAGNOSIS — K219 Gastro-esophageal reflux disease without esophagitis: Secondary | ICD-10-CM | POA: Diagnosis not present

## 2018-03-29 NOTE — Patient Instructions (Addendum)
If you are age 77 or older, your body mass index should be between 23-30. Your Body mass index is 27.96 kg/m. If this is out of the aforementioned range listed, please consider follow up with your Primary Care Provider.  If you are age 77 or younger, your body mass index should be between 19-25. Your Body mass index is 27.96 kg/m. If this is out of the aformentioned range listed, please consider follow up with your Primary Care Provider.   It was a pleasure to meet you today!  Dr. Myrtie Neitheranis   Gastroesophageal Reflux Disease, Adult Normally, food travels down the esophagus and stays in the stomach to be digested. If a person has gastroesophageal reflux disease (GERD), food and stomach acid move back up into the esophagus. When this happens, the esophagus becomes sore and swollen (inflamed). Over time, GERD can make small holes (ulcers) in the lining of the esophagus. Follow these instructions at home: Diet  Follow a diet as told by your doctor. You may need to avoid foods and drinks such as: ? Coffee and tea (with or without caffeine). ? Drinks that contain alcohol. ? Energy drinks and sports drinks. ? Carbonated drinks or sodas. ? Chocolate and cocoa. ? Peppermint and mint flavorings. ? Garlic and onions. ? Horseradish. ? Spicy and acidic foods, such as peppers, chili powder, curry powder, vinegar, hot sauces, and BBQ sauce. ? Citrus fruit juices and citrus fruits, such as oranges, lemons, and limes. ? Tomato-based foods, such as red sauce, chili, salsa, and pizza with red sauce. ? Fried and fatty foods, such as donuts, french fries, potato chips, and high-fat dressings. ? High-fat meats, such as hot dogs, rib eye steak, sausage, ham, and bacon. ? High-fat dairy items, such as whole milk, butter, and cream cheese.  Eat small meals often. Avoid eating large meals.  Avoid drinking large amounts of liquid with your meals.  Avoid eating meals during the 2-3 hours before bedtime.  Avoid  lying down right after you eat.  Do not exercise right after you eat. General instructions  Pay attention to any changes in your symptoms.  Take over-the-counter and prescription medicines only as told by your doctor. Do not take aspirin, ibuprofen, or other NSAIDs unless your doctor says it is okay.  Do not use any tobacco products, including cigarettes, chewing tobacco, and e-cigarettes. If you need help quitting, ask your doctor.  Wear loose clothes. Do not wear anything tight around your waist.  Raise (elevate) the head of your bed about 6 inches (15 cm).  Try to lower your stress. If you need help doing this, ask your doctor.  If you are overweight, lose an amount of weight that is healthy for you. Ask your doctor about a safe weight loss goal.  Keep all follow-up visits as told by your doctor. This is important. Contact a doctor if:  You have new symptoms.  You lose weight and you do not know why it is happening.  You have trouble swallowing, or it hurts to swallow.  You have wheezing or a cough that keeps happening.  Your symptoms do not get better with treatment.  You have a hoarse voice. Get help right away if:  You have pain in your arms, neck, jaw, teeth, or back.  You feel sweaty, dizzy, or light-headed.  You have chest pain or shortness of breath.  You throw up (vomit) and your throw up looks like blood or coffee grounds.  You pass out (faint).  Your  poop (stool) is bloody or black.  You cannot swallow, drink, or eat. This information is not intended to replace advice given to you by your health care provider. Make sure you discuss any questions you have with your health care provider. Document Released: 03/23/2008 Document Revised: 03/12/2016 Document Reviewed: 01/30/2015 Elsevier Interactive Patient Education  2018 [employer].  Food Choices for Gastroesophageal Reflux Disease, Adult When you have gastroesophageal reflux disease (GERD), the foods  you eat and your eating habits are very important. Choosing the right foods can help ease your discomfort. What guidelines do I need to follow?  Choose fruits, vegetables, whole grains, and low-fat dairy products.  Choose low-fat meat, fish, and poultry.  Limit fats such as oils, salad dressings, butter, nuts, and avocado.  Keep a food diary. This helps you identify foods that cause symptoms.  Avoid foods that cause symptoms. These may be different for everyone.  Eat small meals often instead of 3 large meals a day.  Eat your meals slowly, in a place where you are relaxed.  Limit fried foods.  Cook foods using methods other than frying.  Avoid drinking alcohol.  Avoid drinking large amounts of liquids with your meals.  Avoid bending over or lying down until 2-3 hours after eating. What foods are not recommended? These are some foods and drinks that may make your symptoms worse: Vegetables Tomatoes. Tomato juice. Tomato and spaghetti sauce. Chili peppers. Onion and garlic. Horseradish. Fruits Oranges, grapefruit, and lemon (fruit and juice). Meats High-fat meats, fish, and poultry. This includes hot dogs, ribs, ham, sausage, salami, and bacon. Dairy Whole milk and chocolate milk. Sour cream. Cream. Butter. Ice cream. Cream cheese. Drinks Coffee and tea. Bubbly (carbonated) drinks or energy drinks. Condiments Hot sauce. Barbecue sauce. Sweets/Desserts Chocolate and cocoa. Donuts. Peppermint and spearmint. Fats and Oils High-fat foods. This includes [place] fries and potato chips. Other Vinegar. Strong spices. This includes black pepper, white pepper, red pepper, cayenne, curry powder, cloves, ginger, and chili powder. The items listed above may not be a complete list of foods and drinks to avoid. Contact your dietitian for more information. This information is not intended to replace advice given to you by your health care provider. Make sure you discuss any questions you  have with your health care provider. Document Released: 04/05/2012 Document Revised: 03/12/2016 Document Reviewed: 08/09/2013 Elsevier Interactive Patient Education  2017 [employer].   Food Guidelines for a sensitive stomach  Many people have difficulty digesting certain foods, causing a variety of distressing and embarrassing symptoms such as abdominal pain, bloating and gas.  These foods may need to be avoided or consumed in small amounts.  Here are some tips that might be helpful for you.  1.   Lactose intolerance is the difficulty or complete inability to digest lactose, the natural sugar in milk and anything made from milk.  This condition is harmless, common, and can begin any time during life.  Some people can digest a modest amount of lactose while others cannot tolerate any.  Also, not all dairy products contain equal amounts of lactose.  For example, hard cheeses such as parmesan have less lactose than soft cheeses such as cheddar.  Yogurt has less lactose than milk or cheese.  Many packaged foods (even many brands of bread) have milk, so read ingredient lists carefully.  It is difficult to test for lactose intolerance, so just try avoiding lactose as much as possible for a week and see what happens with your symptoms.  If you seem to be lactose intolerant, the best plan is to avoid it (but make sure you get calcium from another source).  The next best thing is to use lactase enzyme supplements, available over the counter everywhere.  Just know that many lactose intolerant people need to take several tablets with each serving of dairy to avoid symptoms.  Lastly, a lot of restaurant food is made with milk or butter.  Many are things you might not suspect, such as mashed potatoes, rice and pasta (cooked with butter) and "grilled" items.  If you are lactose intolerant, it never hurts to ask your server what has milk or butter.  2.   Fiber is an important part of your diet, but not all fiber is  well-tolerated.  Insoluble fiber such as bran is often consumed by normal gut bacteria and converted into gas.  Soluble fiber such as oats, squash, carrots and green beans are typically tolerated better.  3.   Some types of carbohydrates can be poorly digested.  Examples include: fructose (apples, cherries, pears, raisins and other dried fruits), fructans (onions, zucchini, large amounts of wheat), sorbitol/mannitol/xylitol and sucralose/Splenda (common artificial sweeteners), and raffinose (lentils, broccoli, cabbage, asparagus, brussel sprouts, many types of beans).  Do a Programmer, multimedia for [employer] [place] and you will find helpful information. Beano, a dietary supplement, will often help with raffinose-containing foods.  As with lactase tablets, you may need several per serving.  4.   Whenever possible, avoid processed food&meats and chemical additives.  High fructose corn syrup, a common sweetener, may be difficult to digest.  Eggs and soy (comes from the soybean, and added to many foods now) are other common bloating/gassy foods.  - Dr. Sherly[facility]

## 2018-03-29 NOTE — Progress Notes (Signed)
[facility] Consult Note:  History: Frederick LoopClarence Littman Jr. 03/29/2018  Referring physician: Johny BlamerHarris, William, MD  Reason for consult/chief complaint: Gastroesophageal Reflux (onset yrs; Protonix now BID and is helping); Bloated (onset yrs; can wake him at night); Gas; Abdominal Pain (on left side, radiates into hip); Constipation (5 BMs a week, metamucil help); and Colonoscopy (2014)   Subjective  HPI:  This is a pleasant 77 year old man referred by primary care for reevaluation, previously a patient of Dr. Jarold Cisneros.  He had done extensive work-up in both 2013 and 2014 for chronic upper abdominal pain.  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  It sounds as if the patient may have seen equal GI one-time in the last several years, but says he did not care for that physician and does not recall any test done.  He continues to have regurgitation and pyrosis, often worse at night.  He wakes in the middle the night with abdominal bloating and regurgitation that he feels wakes him from sleep.  He has some mid to lower burning abdominal discomfort that sometimes radiates down to his left lower quadrant and his hip.  Frederick Cisneros typically has a BM about 5 times a week now with the help of Metamucil.  Prior to that, when he was less regular, the abdominal pain seemed worse.  His symptoms also are somewhat improved lately on increase of Protonix to twice daily. He has a mechanical bed and elevates the head of bed to decrease reflux symptoms.  He still sometimes eats late in the evening.  He is bothered by intestinal bloating and gas.  He reports seeing a doctor on television recently who discussed abdominal bloating and gas and recommended a medicine that I  am not familiar with.  He denies rectal bleeding, anorexia, weight loss, dysphagia or odynophagia.  ROS:  Review of Systems    Constitutional: Positive for fatigue. Negative for appetite change and unexpected weight change.  HENT: Negative for mouth sores and voice change.   Eyes: Negative for pain and redness.  Respiratory: Positive for shortness of breath. Negative for cough.   Cardiovascular: Negative for chest pain and palpitations.  Genitourinary: Negative for dysuria and hematuria.  Musculoskeletal: Negative for arthralgias and myalgias.  Skin: Negative for pallor and rash.  Neurological: Negative for weakness and headaches.  Hematological: Negative for adenopathy.     Past Medical History: Past Medical History:  Diagnosis Date  . Arthritis   . BPH (benign prostatic hyperplasia)   . CAD (coronary artery disease)   . Diverticulitis   . Dyslipidemia   . Esophageal reflux   . Hiatal hernia   . Hypertension   . LVH (left ventricular hypertrophy)   . Sleep apnea   . Type II or unspecified type diabetes mellitus without mention of complication, not stated as uncontrolled      Past Surgical History: Past Surgical History:  Procedure Laterality Date  . CARDIAC CATHETERIZATION    . CAROTID ENDARTERECTOMY    . IR GENERIC HISTORICAL  04/29/2016   IR RADIOLOGIST EVAL & MGMT 04/29/2016 Gilmer MorJaime [name], DO [facility] INTERV RAD  . IR RADIOLOGIST EVAL & MGMT  12/22/2016  . IR RADIOLOGIST EVAL & MGMT  12/23/2017  . ROTATOR CUFF REPAIR     right  . TRANSURETHRAL RESECTION OF PROSTATE     Severe atherosclerotic peripheral arterial disease  Family History: Family History  Problem Relation Age of Onset  .  Breast cancer Sister   . Diabetes Sister        x 3  . GI problems Father        type unknown  . Colon cancer Neg Hx     Social History: Social History   Socioeconomic History  . Marital status: Married    Spouse name: Not on file  . Number of children: 4  . Years of education: Not on file  . Highest education level: Not on file  Occupational History  . Occupation: retired  Engineer, production  .  Financial resource strain: Not on file  . Food insecurity:    Worry: Not on file    Inability: Not on file  . Transportation needs:    Medical: Not on file    Non-medical: Not on file  Tobacco Use  . Smoking status: Former Smoker    Last attempt to quit: 10/19/1988    Years since quitting: 29.4  . Smokeless tobacco: Never Used  Substance and Sexual Activity  . Alcohol use: Yes    Comment: occasional wine  . Drug use: No  . Sexual activity: Not on file  Lifestyle  . Physical activity:    Days per week: Not on file    Minutes per session: Not on file  . Stress: Not on file  Relationships  . Social connections:    Talks on phone: Not on file    Gets together: Not on file    Attends religious service: Not on file    Active member of club or organization: Not on file    Attends meetings of clubs or organizations: Not on file    Relationship status: Not on file  Other Topics Concern  . Not on file  Social History Narrative  . Not on file    Allergies: No Known Allergies  Outpatient Meds: Current Outpatient Medications  Medication Sig Dispense Refill  . amLODipine (NORVASC) 5 MG tablet Take 5 mg by mouth daily.     Marland Kitchen aspirin 325 MG tablet Take 325 mg by mouth daily.    . clidinium-chlordiazePOXIDE (LIBRAX) 2.5-5 MG per capsule Take 1 capsule by mouth 2 (two) times daily as needed. 60 capsule 3  . clopidogrel (PLAVIX) 75 MG tablet Take 1 tablet (75 mg total) by mouth daily. 30 tablet 0  . ketoconazole (NIZORAL) 2 % cream Apply 1 application topically daily as needed for irritation (itching). Reported on 11/05/2015    . lisinopril (PRINIVIL,ZESTRIL) 10 MG tablet Take 10 mg by mouth daily.     . metoprolol succinate (TOPROL-XL) 100 MG 24 hr tablet Take 100 mg by mouth daily. Take with or immediately following a meal.    . Multiple Vitamin (MULTIVITAMIN WITH MINERALS) TABS tablet Take 1 tablet by mouth daily.    Marland Kitchen OVER THE COUNTER MEDICATION Place 1 drop into both eyes daily as  needed (dry eyes/ irritation). Over the counter eye drop    . pantoprazole (PROTONIX) 40 MG tablet Take 40 mg by mouth 2 (two) times daily.    . rosuvastatin (CRESTOR) 40 MG tablet Take 40 mg by mouth daily. Reported on 11/05/2015    . sucralfate (CARAFATE) 1 GM/10ML suspension One tablespoon after meals and at bedtime 420 mL 1   No current facility-administered medications for this visit.       ___________________________________________________________________ Objective   Exam:  BP (!) 146/62   Pulse 65   Ht 5\' 5"  (1.651 m)   Wt 168 lb (76.2 kg)  BMI 27.96 kg/m    General: this is a(n) well-appearing man  Eyes: sclera anicteric, no redness  ENT: oral mucosa moist without lesions, no cervical or supraclavicular lymphadenopathy, good dentition  CV: RRR without murmur, S1/S2, no JVD, no peripheral edema  Resp: clear to auscultation bilaterally, normal RR and effort noted.   GI: soft, no tenderness, with active bowel sounds. No guarding or palpable organomegaly noted.  + bruit  Skin; warm and dry, no rash or jaundice noted  Neuro: awake, alert and oriented x 3. Normal gross motor function and fluent speech  Rad studies:  CTAP 11/2017  With severe aortoiliac atherosclerosis and left iliac stent   Assessment: Encounter Diagnoses  Name Primary?  . Gastroesophageal reflux disease, esophagitis presence not specified Yes  . Abdominal bloating   . Lower abdominal pain     Many years and chronic stable symptoms with previous extensive and unrevealing work-up.  He has reflux and mild constipation with a functional overlay to symptoms.  I done my best to reassure him.  I do not think he needs further testing, and do not think his symptoms are due to intestinal ischemia.  He has no postprandial abdominal pain, food avoidance or weight loss to suggest nonocclusive mesenteric ischemia.  Plan:  Continue current dose Protonix  written GERD advice given, particular attention to  diet and lifestyle measures  Written dietary advice given regarding bloating and gas, some of which is from maldigestion I have no further testing planned at this time he can see me as needed.  Thank you for the courtesy of this consult.  Please call me with any questions or concerns.  Charlie Pitter III  CC: Frederick Blamer, MD

## 2018-10-06 ENCOUNTER — Ambulatory Visit
Admission: RE | Admit: 2018-10-06 | Discharge: 2018-10-06 | Disposition: A | Discharge status: Home / Self Care | Payer: Medicare Other | Source: Ambulatory Visit | Attending: Family Medicine | Admitting: Family Medicine

## 2018-10-06 ENCOUNTER — Other Ambulatory Visit: Payer: Self-pay | Admitting: Family Medicine

## 2018-10-06 DIAGNOSIS — R0789 Other chest pain: Secondary | ICD-10-CM

## 2018-10-06 DIAGNOSIS — R0689 Other abnormalities of breathing: Secondary | ICD-10-CM

## 2018-10-06 DIAGNOSIS — R0989 Other specified symptoms and signs involving the circulatory and respiratory systems: Secondary | ICD-10-CM

## 2018-11-05 ENCOUNTER — Encounter (HOSPITAL_COMMUNITY): Payer: Self-pay

## 2018-11-05 ENCOUNTER — Ambulatory Visit (HOSPITAL_COMMUNITY)
Admission: EM | Admit: 2018-11-05 | Discharge: 2018-11-05 | Disposition: A | Discharge status: Home / Self Care | Payer: Medicare Other | Attending: Family Medicine | Admitting: Family Medicine

## 2018-11-05 ENCOUNTER — Other Ambulatory Visit: Payer: Self-pay

## 2018-11-05 DIAGNOSIS — R197 Diarrhea, unspecified: Secondary | ICD-10-CM | POA: Diagnosis not present

## 2018-11-05 NOTE — ED Triage Notes (Signed)
Pt presents today with diarrhea since Wednesday night. States he has not had any nausea or vomiting. Has had episodes of being cold and hot but not sure if he has had a fever. States he has had body aches. Nobody in his family has been sick with these sxs.

## 2018-11-05 NOTE — Discharge Instructions (Signed)
Return with your stool sample as soon as you're able.   Please do your best to ensure adequate fluid intake in order to avoid dehydration. If you find that you are unable to tolerate drinking fluids regularly please proceed to the Emergency Department for evaluation.  Also, you should return to the hospital if you experience persistent fevers for greater than 1-2 more days, increasing abdominal pain that persists despite medications, persistent diarrhea, dizziness, syncope (fainting), or for any other concerns you may deem worrisome.

## 2018-11-05 NOTE — ED Notes (Signed)
Patient given stool sample collection kit for home

## 2018-11-06 ENCOUNTER — Other Ambulatory Visit
Admission: RE | Admit: 2018-11-06 | Discharge: 2018-11-06 | Disposition: A | Discharge status: Home / Self Care | Payer: Medicare Other | Source: Ambulatory Visit | Attending: Family Medicine | Admitting: Family Medicine

## 2018-11-06 ENCOUNTER — Telehealth (HOSPITAL_COMMUNITY): Payer: Self-pay | Admitting: Family Medicine

## 2018-11-06 DIAGNOSIS — E119 Type 2 diabetes mellitus without complications: Secondary | ICD-10-CM | POA: Insufficient documentation

## 2018-11-06 DIAGNOSIS — R109 Unspecified abdominal pain: Secondary | ICD-10-CM | POA: Diagnosis not present

## 2018-11-06 DIAGNOSIS — I10 Essential (primary) hypertension: Secondary | ICD-10-CM | POA: Diagnosis not present

## 2018-11-06 DIAGNOSIS — R197 Diarrhea, unspecified: Secondary | ICD-10-CM | POA: Insufficient documentation

## 2018-11-06 DIAGNOSIS — I2510 Atherosclerotic heart disease of native coronary artery without angina pectoris: Secondary | ICD-10-CM | POA: Diagnosis not present

## 2018-11-06 DIAGNOSIS — Z8719 Personal history of other diseases of the digestive system: Secondary | ICD-10-CM | POA: Diagnosis not present

## 2018-11-06 DIAGNOSIS — E785 Hyperlipidemia, unspecified: Secondary | ICD-10-CM | POA: Insufficient documentation

## 2018-11-06 DIAGNOSIS — N400 Enlarged prostate without lower urinary tract symptoms: Secondary | ICD-10-CM | POA: Diagnosis not present

## 2018-11-06 NOTE — Telephone Encounter (Signed)
Need to reorder the GI pathogen panel for todays date

## 2018-11-07 LAB — GASTROINTESTINAL PANEL BY PCR, STOOL (REPLACES STOOL CULTURE)

## 2018-11-07 NOTE — ED Provider Notes (Signed)
[facility]   [id number] 11/05/18 Arrival Time: 3790  ASSESSMENT & PLAN:  1. Diarrhea, unspecified type    No signs of dehydration requiring IVF at this time. Still very possible this is viral in nature and may resolve on its own over the next 48 hours. Discussed. He prefers to proceed with GI Panel.  Orders Placed This Encounter  Procedures  . Gastrointestinal Panel by PCR , Stool    Standing Status:   Standing    Number of Occurrences:   1   Stool kit given. He will return with stool sample.  No empiric treatment at this time. No signs of dehydration. Normal abdomainal exam. Discussed.  Will do his best to ensure adequate fluid intake in order to avoid dehydration. Will proceed to the Emergency Department for evaluation if unable to tolerate PO fluids regularly.  Reviewed expectations re: course of current medical issues. Questions answered. Outlined signs and symptoms indicating need for more acute intervention. Patient verbalized understanding. After Visit Summary given.   SUBJECTIVE: History from: patient.  Frederick Cisneros. is a 78 y.o. male who presents with complaint of non-bilious, non-bloody intermittent diarrhea without emesis or significant nausea. Onset abrupt about 3-4 days ago. Abdominal discomfort: mild and cramping in nature; noticed this after episodes of diarrhea. Symptoms are stable since beginning but not improving much. Aggravating factors: eating. Alleviating factors: none. Associated symptoms: mild fatigue. He denies chills, fever, headache, myalgias and sweats. Appetite: normal. PO intake: decreased "because it goes right through me". Ambulatory without assistance. Urinary symptoms: none. Sick contacts: none. Recent travel or camping: none. OTC treatment: took one Imodium last evening; little help.  Past Surgical History:  Procedure Laterality Date  . CARDIAC CATHETERIZATION    . CAROTID ENDARTERECTOMY    . IR GENERIC HISTORICAL   04/29/2016   IR RADIOLOGIST EVAL & MGMT 04/29/2016 Corrie Mckusick, DO [facility] INTERV RAD  . IR RADIOLOGIST EVAL & MGMT  12/22/2016  . IR RADIOLOGIST EVAL & MGMT  12/23/2017  . ROTATOR CUFF REPAIR     right  . TRANSURETHRAL RESECTION OF PROSTATE     ROS: As per HPI. All other systems negative   OBJECTIVE:  Vitals:   11/05/18 1238  BP: 138/63  Pulse: 75  Resp: 16  Temp: 98.1 F (36.7 C)  TempSrc: Oral  SpO2: 99%    General appearance: alert; no distress Oropharynx: moist; lips moist Lungs: clear to auscultation bilaterally; unlabored Heart: regular rate and rhythm Abdomen: soft; non-distended; no significant abdominal tenderness; reports "a little cramping" feeling with palpation throughout; bowel sounds present; no masses or organomegaly; no guarding or rebound tenderness Back: no CVA tenderness Extremities: no edema; symmetrical with no gross deformities Skin: warm; dry Neurologic: normal gait Psychological: alert and cooperative; normal mood and affect  Labs Pending:  Labs Reviewed  GASTROINTESTINAL PANEL BY PCR, STOOL    No Known Allergies                                             Past Medical History:  Diagnosis Date  . Arthritis   . BPH (benign prostatic hyperplasia)   . CAD (coronary artery disease)   . Diverticulitis   . Dyslipidemia   . Esophageal reflux   . Hiatal hernia   . Hypertension   . LVH (left ventricular hypertrophy)   . Sleep apnea   . Type  II or unspecified type diabetes mellitus without mention of complication, not stated as uncontrolled    Social History   Socioeconomic History  . Marital status: Married    Spouse name: Not on file  . Number of children: 4  . Years of education: Not on file  . Highest education level: Not on file  Occupational History  . Occupation: retired  Scientific laboratory technician  . Financial resource strain: Not on file  . Food insecurity:    Worry: Not on file    Inability: Not on file  . Transportation needs:    Medical:  Not on file    Non-medical: Not on file  Tobacco Use  . Smoking status: Former Smoker    Last attempt to quit: 10/19/1988    Years since quitting: 30.0  . Smokeless tobacco: Never Used  Substance and Sexual Activity  . Alcohol use: Yes    Comment: occasional wine  . Drug use: No  . Sexual activity: Not on file  Lifestyle  . Physical activity:    Days per week: Not on file    Minutes per session: Not on file  . Stress: Not on file  Relationships  . Social connections:    Talks on phone: Not on file    Gets together: Not on file    Attends religious service: Not on file    Active member of club or organization: Not on file    Attends meetings of clubs or organizations: Not on file    Relationship status: Not on file  . Intimate partner violence:    Fear of current or ex partner: Not on file    Emotionally abused: Not on file    Physically abused: Not on file    Forced sexual activity: Not on file  Other Topics Concern  . Not on file  Social History Narrative  . Not on file   Family History  Problem Relation Age of Onset  . Breast cancer Sister   . Diabetes Sister        x 3  . GI problems Father        type unknown  . Colon cancer Neg Hx      Vanessa Kick, MD 11/09/18 [phone]

## 2018-12-06 ENCOUNTER — Other Ambulatory Visit: Payer: Self-pay | Admitting: Interventional Radiology

## 2018-12-06 DIAGNOSIS — I739 Peripheral vascular disease, unspecified: Secondary | ICD-10-CM

## 2018-12-14 ENCOUNTER — Ambulatory Visit
Admission: RE | Admit: 2018-12-14 | Discharge: 2018-12-14 | Disposition: A | Discharge status: Home / Self Care | Payer: Medicare Other | Source: Ambulatory Visit | Attending: Interventional Radiology | Admitting: Interventional Radiology

## 2018-12-14 ENCOUNTER — Encounter: Payer: Self-pay | Admitting: Radiology

## 2018-12-14 DIAGNOSIS — I739 Peripheral vascular disease, unspecified: Secondary | ICD-10-CM

## 2018-12-14 HISTORY — PX: IR RADIOLOGIST EVAL & MGMT: IMG5224

## 2018-12-14 NOTE — Progress Notes (Signed)
Chief Complaint: The patient is seen in follow up today s/p iliac angiogram for peripheral artery disease  History of present illness: Frederick Cisnerosis a 78 y.o.male who presents to IR clinic today for scheduled follow-up of his peripheral arterial disease.  He is s/p stenting of a flow-limiting lesion of the left iliac artery on 04/11/2015.  He continues to do well at home since his procedure.  He remains active and continues to work-out at the [facility] several times weekly.  He is walking 2 miles on the treadmill and pedaling the recumbant bicycle without difficulty, pain, or claudication. He denies changes to his medical status and continues to follow-up with his PCP for medical management of his hypertension, hyperlipidemia, and Type II diabetes.   A repeat non-invasive exam is performed today and has been reviewed by Dr. Earleen Newport.   Past Medical History:  Diagnosis Date  . Arthritis   . BPH (benign prostatic hyperplasia)   . CAD (coronary artery disease)   . Diverticulitis   . Dyslipidemia   . Esophageal reflux   . Hiatal hernia   . Hypertension   . LVH (left ventricular hypertrophy)   . Sleep apnea   . Type II or unspecified type diabetes mellitus without mention of complication, not stated as uncontrolled     Past Surgical History:  Procedure Laterality Date  . CARDIAC CATHETERIZATION    . CAROTID ENDARTERECTOMY    . IR GENERIC HISTORICAL  04/29/2016   IR RADIOLOGIST EVAL & MGMT 04/29/2016 Corrie Mckusick, DO [facility] INTERV RAD  . IR RADIOLOGIST EVAL & MGMT  12/22/2016  . IR RADIOLOGIST EVAL & MGMT  12/23/2017  . ROTATOR CUFF REPAIR     right  . TRANSURETHRAL RESECTION OF PROSTATE      Allergies: Patient has no known allergies.  Medications: Prior to Admission medications   Medication Sig Start Date End Date Taking? Authorizing Provider  amLODipine (NORVASC) 5 MG tablet Take 5 mg by mouth daily.     [provider]  aspirin 325 MG tablet Take 325 mg by mouth  daily.    [provider]  clidinium-chlordiazePOXIDE (LIBRAX) 2.5-5 MG per capsule Take 1 capsule by mouth 2 (two) times daily as needed. 12/13/12   Sable Feil, MD  clopidogrel (PLAVIX) 75 MG tablet Take 1 tablet (75 mg total) by mouth daily. 04/11/15   Corrie Mckusick, DO  ketoconazole (NIZORAL) 2 % cream Apply 1 application topically daily as needed for irritation (itching). Reported on 11/05/2015 08/08/13   [provider]  lisinopril (PRINIVIL,ZESTRIL) 10 MG tablet Take 10 mg by mouth daily.  08/29/13   [provider]  metoprolol succinate (TOPROL-XL) 100 MG 24 hr tablet Take 100 mg by mouth daily. Take with or immediately following a meal.    [provider]  Multiple Vitamin (MULTIVITAMIN WITH MINERALS) TABS tablet Take 1 tablet by mouth daily.    [provider]  OVER THE COUNTER MEDICATION Place 1 drop into both eyes daily as needed (dry eyes/ irritation). Over the counter eye drop    [provider]  pantoprazole (PROTONIX) 40 MG tablet Take 40 mg by mouth 2 (two) times daily.    [provider]  rosuvastatin (CRESTOR) 40 MG tablet Take 40 mg by mouth daily. Reported on 11/05/2015    [provider]  sucralfate (CARAFATE) 1 GM/10ML suspension One tablespoon after meals and at bedtime 09/26/13   Sable Feil, MD     Family History  Problem  Relation Age of Onset  . Breast cancer Sister   . Diabetes Sister        x 3  . GI problems Father        type unknown  . Colon cancer Neg Hx     Social History   Socioeconomic History  . Marital status: Married    Spouse name: Not on file  . Number of children: 4  . Years of education: Not on file  . Highest education level: Not on file  Occupational History  . Occupation: retired  Scientific laboratory technician  . Financial resource strain: Not on file  . Food insecurity:    Worry: Not on file    Inability: Not on file  . Transportation needs:    Medical: Not on file     Non-medical: Not on file  Tobacco Use  . Smoking status: Former Smoker    Last attempt to quit: 10/19/1988    Years since quitting: 30.1  . Smokeless tobacco: Never Used  Substance and Sexual Activity  . Alcohol use: Yes    Comment: occasional wine  . Drug use: No  . Sexual activity: Not on file  Lifestyle  . Physical activity:    Days per week: Not on file    Minutes per session: Not on file  . Stress: Not on file  Relationships  . Social connections:    Talks on phone: Not on file    Gets together: Not on file    Attends religious service: Not on file    Active member of club or organization: Not on file    Attends meetings of clubs or organizations: Not on file    Relationship status: Not on file  Other Topics Concern  . Not on file  Social History Narrative  . Not on file     Vital Signs: There were no vitals taken for this visit.  Physical Exam  NAD, resting comfortably in chair, ambulatory Heart: RRR, no murmurs or rubs Resp: clear to auscultation bilaterally MSK:  ROM normal, no swelling or tenderness PV:  Distal pulses intact bilaterally   Imaging: No results found.  Labs:  CBC: No results for input(s): WBC, HGB, HCT, PLT in the last 8760 hours.  COAGS: No results for input(s): INR, APTT in the last 8760 hours.  BMP: No results for input(s): NA, K, CL, CO2, GLUCOSE, BUN, CALCIUM, CREATININE, GFRNONAA, GFRAA in the last 8760 hours.  Invalid input(s): CMP  LIVER FUNCTION TESTS: No results for input(s): BILITOT, AST, ALT, ALKPHOS, PROT, ALBUMIN in the last 8760 hours.  Assessment: S/p balloon angioplasty and stenting of left iliac stenosis 04/11/15.  Mr. Teters continues to do well at home without symptoms or limits to his lifetsyle/activity after treatment of severe stenosis in the left iliac artery.    His non-invasive testing is reviewed by Dr. Earleen Newport who has met with patient to discuss these findings today.  Continue maximal medical therapy at  this time. Follow-up per Dr. Earleen Newport.   Signed: Docia Barrier, PA 12/14/2018, 9:46 AM   Please refer to Dr. Earleen Newport attestation of this note for management and plan.

## 2019-10-06 ENCOUNTER — Encounter: Payer: Self-pay | Admitting: Gastroenterology

## 2019-10-06 ENCOUNTER — Ambulatory Visit: Payer: Medicare Other | Admitting: Gastroenterology

## 2019-10-06 VITALS — BP 170/70 | HR 68 | Temp 98.4°F | Ht 65.5 in | Wt 171.0 lb

## 2019-10-06 DIAGNOSIS — R140 Abdominal distension (gaseous): Secondary | ICD-10-CM

## 2019-10-06 DIAGNOSIS — R12 Heartburn: Secondary | ICD-10-CM | POA: Diagnosis not present

## 2019-10-06 DIAGNOSIS — Z1159 Encounter for screening for other viral diseases: Secondary | ICD-10-CM | POA: Diagnosis not present

## 2019-10-06 DIAGNOSIS — R1013 Epigastric pain: Secondary | ICD-10-CM

## 2019-10-06 NOTE — Patient Instructions (Addendum)
If you are age 78 or older, your body mass index should be between 23-30. Your Body mass index is 28.02 kg/m. If this is out of the aforementioned range listed, please consider follow up with your Primary Care Provider.  If you are age 65 or younger, your body mass index should be between 19-25. Your Body mass index is 28.02 kg/m. If this is out of the aformentioned range listed, please consider follow up with your Primary Care Provider.   Stop Protonix(pantoprazole), Take one tablet once a day for a week, then one tablet every-other day for a week, then stop.  You have been scheduled for an endoscopy. Please follow written instructions given to you at your visit today. If you use inhalers (even only as needed), please bring them with you on the day of your procedure. Your physician has requested that you go to www.startemmi.com and enter the access code given to you at your visit today. This web site gives a general overview about your procedure. However, you should still follow specific instructions given to you by our office regarding your preparation for the procedure.   It was a pleasure to see you today!  Dr. Loletha Carrow   Food Guidelines for gas and bloating  Many people have difficulty digesting certain foods, causing a variety of distressing and embarrassing symptoms such as abdominal pain, bloating and gas.  These foods may need to be avoided or consumed in small amounts.  Here are some tips that might be helpful for you.  1.   Lactose intolerance is the difficulty or complete inability to digest lactose, the natural sugar in milk and anything made from milk.  This condition is harmless, common, and can begin any time during life.  Some people can digest a modest amount of lactose while others cannot tolerate any.  Also, not all dairy products contain equal amounts of lactose.  For example, hard cheeses such as parmesan have less lactose than soft cheeses such as cheddar.  Yogurt has less  lactose than milk or cheese.  Many packaged foods (even many brands of bread) have milk, so read ingredient lists carefully.  It is difficult to test for lactose intolerance, so just try avoiding lactose as much as possible for a week and see what happens with your symptoms.  If you seem to be lactose intolerant, the best plan is to avoid it (but make sure you get calcium from another source).  The next best thing is to use lactase enzyme supplements, available over the counter everywhere.  Just know that many lactose intolerant people need to take several tablets with each serving of dairy to avoid symptoms.  Lastly, a lot of restaurant food is made with milk or butter.  Many are things you might not suspect, such as mashed potatoes, rice and pasta (cooked with butter) and "grilled" items.  If you are lactose intolerant, it never hurts to ask your server what has milk or butter.  2.   Fiber is an important part of your diet, but not all fiber is well-tolerated.  Insoluble fiber such as bran is often consumed by normal gut bacteria and converted into gas.  Soluble fiber such as oats, squash, carrots and green beans are typically tolerated better.  3.   Some types of carbohydrates can be poorly digested.  Examples include: fructose (apples, cherries, pears, raisins and other dried fruits), fructans (onions, zucchini, large amounts of wheat), sorbitol/mannitol/xylitol and sucralose/Splenda (common artificial sweeteners), and raffinose (lentils, broccoli, cabbage,  asparagus, brussel sprouts, many types of beans).  Do a Programmer, multimedia for [employer] and you will find helpful information. Beano, a dietary supplement, will often help with raffinose-containing foods.  As with lactase tablets, you may need several per serving.  4.   Whenever possible, avoid processed food&meats and chemical additives.  High fructose corn syrup, a common sweetener, may be difficult to digest.  Eggs and soy (comes from the soybean, and  added to many foods now) are other common bloating/gassy foods.  - Dr. Sherlynn [facility] ________________________________________________________________________

## 2019-10-06 NOTE — Progress Notes (Signed)
[facility] Progress Note  Chief Complaint: Epigastric pain and bloating  Subjective  History:  Rodrecus was last seen by me June 2019 for longstanding epigastric/lower chest discomfort with intermittent heartburn, generalized abdominal bloating, sore and dry throat.  That detailed consult note describes prior work-up and history of symptoms with Dr. Sharlett Iles.  He describes things very much the same now, with a bloated upper abdominal discomfort, sometimes worse overnight and he feels it may wake him from sleep.  Twice daily Protonix is not happening.  He was recently prescribed Carafate but stopped it because it made him feel worse.  He has generalized bloating and excess gas, and when he has the epigastric/chest pain, it might radiate to the left upper quadrant, then down the left side of the abdomen into his hip, as he had previously described it.  Denies dysphagia or odynophagia.  He often wakes at night or in the morning feeling like his mouth and throat are very dry "all the way down to my stomach", he frequently clears his throat and has a feeling of fullness in the neck and throat.  Sometimes he has a burning feeling in the throat and bad taste.  ROS: Cardiovascular:  no chest pain Respiratory: no dyspnea Remainder of systems negative except as above The patient's Past Medical, Family and Social History were reviewed and are on file in the EMR.  Objective:  Med list reviewed  Current Outpatient Medications:  .  amLODipine (NORVASC) 5 MG tablet, Take 5 mg by mouth daily. , Disp: , Rfl:  .  aspirin 325 MG tablet, Take 325 mg by mouth daily., Disp: , Rfl:  .  clidinium-chlordiazePOXIDE (LIBRAX) 2.5-5 MG per capsule, Take 1 capsule by mouth 2 (two) times daily as needed., Disp: 60 capsule, Rfl: 3 .  ketoconazole (NIZORAL) 2 % cream, Apply 1 application topically daily as needed for irritation (itching). Reported on 11/05/2015, Disp: , Rfl:  .  lisinopril (PRINIVIL,ZESTRIL)  10 MG tablet, Take 10 mg by mouth daily. , Disp: , Rfl:  .  metoprolol succinate (TOPROL-XL) 100 MG 24 hr tablet, Take 100 mg by mouth daily. Take with or immediately following a meal., Disp: , Rfl:  .  Multiple Vitamin (MULTIVITAMIN WITH MINERALS) TABS tablet, Take 1 tablet by mouth daily., Disp: , Rfl:  .  OVER THE COUNTER MEDICATION, Place 1 drop into both eyes daily as needed (dry eyes/ irritation). Over the counter eye drop, Disp: , Rfl:  .  pantoprazole (PROTONIX) 40 MG tablet, Take 40 mg by mouth 2 (two) times daily., Disp: , Rfl:  .  rosuvastatin (CRESTOR) 40 MG tablet, Take 40 mg by mouth daily. Reported on 11/05/2015, Disp: , Rfl:  .  sucralfate (CARAFATE) 1 GM/10ML suspension, One tablespoon after meals and at bedtime (Patient not taking: Reported on 12/14/2018), Disp: 420 mL, Rfl: 1  On further questioning, told us he was no longer on Plavix.  Vital signs in last 24 hrs: Vitals:   10/06/19 0939  BP: (!) 170/70  Pulse: 68  Temp: 98.4 F (36.9 C)    Physical Exam  Well-appearing, normal vocal quality  HEENT: sclera anicteric, oral mucosa moist without lesions  Neck: supple, no thyromegaly, JVD or lymphadenopathy  Cardiac: RRR without murmurs, S1S2 heard, no peripheral edema  Pulm: clear to auscultation bilaterally, normal RR and effort noted  Abdomen: soft, no tenderness, with active bowel sounds. No guarding or palpable hepatosplenomegaly.  Skin; warm and dry, no jaundice or rash    @  ASSESSMENTPLANBEGIN@ Assessment: Encounter Diagnoses  Name Primary?  . Epigastric pain Yes  . Heartburn   . Abdominal bloating   . Screening for viral disease    It is still somewhat difficult to characterize his symptoms, and they are not entirely consistent with GERD.  The bloating discomfort gas and flatulence seem likely to be dietary related, and perhaps a functional element as well.    Plan: Some written dietary advice was given regarding bloating and gas  Decrease  Protonix to once daily for the next week, then every other day for a week, then stop.  Upper endoscopy scheduled.  He was agreeable after discussion of procedure and risks.  The benefits and risks of the planned procedure were described in detail with the patient or (when appropriate) their health care proxy.  Risks were outlined as including, but not limited to, bleeding, infection, perforation, adverse medication reaction leading to cardiac or pulmonary decompensation, pancreatitis (if ERCP).  The limitation of incomplete mucosal visualization was also discussed.  No guarantees or warranties were given.   Total time 25 minutes, over half spent face-to-face with patient in counseling and coordination of care.   Charlie Pitter III

## 2019-11-08 ENCOUNTER — Other Ambulatory Visit: Payer: Self-pay | Admitting: Gastroenterology

## 2019-11-08 ENCOUNTER — Ambulatory Visit (INDEPENDENT_AMBULATORY_CARE_PROVIDER_SITE_OTHER): Payer: Medicare Other

## 2019-11-08 DIAGNOSIS — Z1159 Encounter for screening for other viral diseases: Secondary | ICD-10-CM

## 2019-11-08 LAB — SARS CORONAVIRUS 2 (TAT 6-24 HRS): SARS Coronavirus 2: NEGATIVE

## 2019-11-10 ENCOUNTER — Other Ambulatory Visit: Payer: Self-pay

## 2019-11-10 ENCOUNTER — Encounter: Payer: Self-pay | Admitting: Gastroenterology

## 2019-11-10 ENCOUNTER — Ambulatory Visit (AMBULATORY_SURGERY_CENTER): Payer: Medicare Other | Admitting: Gastroenterology

## 2019-11-10 VITALS — BP 133/66 | HR 52 | Temp 97.5°F | Resp 14 | Ht 65.5 in | Wt 171.0 lb

## 2019-11-10 DIAGNOSIS — K3189 Other diseases of stomach and duodenum: Secondary | ICD-10-CM

## 2019-11-10 DIAGNOSIS — R140 Abdominal distension (gaseous): Secondary | ICD-10-CM

## 2019-11-10 DIAGNOSIS — R1013 Epigastric pain: Secondary | ICD-10-CM

## 2019-11-10 DIAGNOSIS — K2950 Unspecified chronic gastritis without bleeding: Secondary | ICD-10-CM

## 2019-11-10 DIAGNOSIS — K2980 Duodenitis without bleeding: Secondary | ICD-10-CM | POA: Diagnosis not present

## 2019-11-10 MED ORDER — SODIUM CHLORIDE 0.9 % IV SOLN: 500.0000 mL | Freq: Once | INTRAVENOUS | Status: DC

## 2019-11-10 NOTE — Progress Notes (Signed)
Temp-LC VS-KA  Pt's states no medical or surgical changes since previsit or office visit.  

## 2019-11-10 NOTE — Progress Notes (Signed)
Called to room to assist during endoscopic procedure.  Patient ID and intended procedure confirmed with present staff. Received instructions for my participation in the procedure from the performing physician.  

## 2019-11-10 NOTE — Progress Notes (Signed)
A and O x3. Report to RN. Tolerated MAC anesthesia well.Teeth unchanged after procedure.

## 2019-11-10 NOTE — Patient Instructions (Addendum)
Handout on gastritis given  YOU HAD AN ENDOSCOPIC PROCEDURE TODAY AT [facility]:   Refer to the procedure report that was given to you for any specific questions about what was found during the examination.  If the procedure report does not answer your questions, please call your gastroenterologist to clarify.  If you requested that your care partner not be given the details of your procedure findings, then the procedure report has been included in a sealed envelope for you to review at your convenience later.  YOU SHOULD EXPECT: Some feelings of bloating in the abdomen. Passage of more gas than usual.  Walking can help get rid of the air that was put into your GI tract during the procedure and reduce the bloating. If you had a lower endoscopy (such as a colonoscopy or flexible sigmoidoscopy) you may notice spotting of blood in your stool or on the toilet paper. If you underwent a bowel prep for your procedure, you may not have a normal bowel movement for a few days.  Please Note:  You might notice some irritation and congestion in your nose or some drainage.  This is from the oxygen used during your procedure.  There is no need for concern and it should clear up in a day or so.  SYMPTOMS TO REPORT IMMEDIATELY:   Following upper endoscopy (EGD)  Vomiting of blood or coffee ground material  New chest pain or pain under the shoulder blades  Painful or persistently difficult swallowing  New shortness of breath  Fever of 100F or higher  Black, tarry-looking stools  For urgent or emergent issues, a gastroenterologist can be reached at any hour by calling [phone].   DIET:  We do recommend a small meal at first, but then you may proceed to your regular diet.  Drink plenty of fluids but you should avoid alcoholic beverages for 24 hours.  ACTIVITY:  You should plan to take it easy for the rest of today and you should NOT DRIVE or use heavy machinery until tomorrow (because of  the sedation medicines used during the test).    FOLLOW UP: Our staff will call the number listed on your records 48-72 hours following your procedure to check on you and address any questions or concerns that you may have regarding the information given to you following your procedure. If we do not reach you, we will leave a message.  We will attempt to reach you two times.  During this call, we will ask if you have developed any symptoms of COVID 19. If you develop any symptoms (ie: fever, flu-like symptoms, shortness of breath, cough etc.) before then, please call [phone].  If you test positive for Covid 19 in the 2 weeks post procedure, please call and report this information to [place].    If any biopsies were taken you will be contacted by phone or by letter within the next 1-3 weeks.  Please call us at [phone] if you have not heard about the biopsies in 3 weeks.    SIGNATURES/CONFIDENTIALITY: You and/or your care partner have signed paperwork which will be entered into your electronic medical record.  These signatures attest to the fact that that the information above on your After Visit Summary has been reviewed and is understood.  Full responsibility of the confidentiality of this discharge information lies with you and/or your care-partner.   ___________________________________________________________________  Food Guidelines for gas and bloating  Many people have difficulty digesting certain foods,  causing a variety of distressing and embarrassing symptoms such as abdominal pain, bloating and gas.  These foods may need to be avoided or consumed in small amounts.  Here are some tips that might be helpful for you.  1.   Lactose intolerance is the difficulty or complete inability to digest lactose, the natural sugar in milk and anything made from milk.  This condition is harmless, common, and can begin any time during life.  Some people can digest a modest amount of lactose while  others cannot tolerate any.  Also, not all dairy products contain equal amounts of lactose.  For example, hard cheeses such as parmesan have less lactose than soft cheeses such as cheddar.  Yogurt has less lactose than milk or cheese.  Many packaged foods (even many brands of bread) have milk, so read ingredient lists carefully.  It is difficult to test for lactose intolerance, so just try avoiding lactose as much as possible for a week and see what happens with your symptoms.  If you seem to be lactose intolerant, the best plan is to avoid it (but make sure you get calcium from another source).  The next best thing is to use lactase enzyme supplements, available over the counter everywhere.  Just know that many lactose intolerant people need to take several tablets with each serving of dairy to avoid symptoms.  Lastly, a lot of restaurant food is made with milk or butter.  Many are things you might not suspect, such as mashed potatoes, rice and pasta (cooked with butter) and "grilled" items.  If you are lactose intolerant, it never hurts to ask your server what has milk or butter.  2.   Fiber is an important part of your diet, but not all fiber is well-tolerated.  Insoluble fiber such as bran is often consumed by normal gut bacteria and converted into gas.  Soluble fiber such as oats, squash, carrots and green beans are typically tolerated better.  3.   Some types of carbohydrates can be poorly digested.  Examples include: fructose (apples, cherries, pears, raisins and other dried fruits), fructans (onions, zucchini, large amounts of wheat), sorbitol/mannitol/xylitol and sucralose/Splenda (common artificial sweeteners), and raffinose (lentils, broccoli, cabbage, asparagus, brussel sprouts, many types of beans).  Do a Programmer, multimedia for [employer] and you will find helpful information. Beano, a dietary supplement, will often help with raffinose-containing foods.  As with lactase tablets, you may need several per  serving.  4.   Whenever possible, avoid processed food&meats and chemical additives.  High fructose corn syrup, a common sweetener, may be difficult to digest.  Eggs and soy (comes from the soybean, and added to many foods now) are other common bloating/gassy foods.  - Dr. Sherlynn [facility]

## 2019-11-10 NOTE — Op Note (Addendum)
[place] Patient Name: Rocklin Soderquist Procedure Date: 11/10/2019 10:07 AM MRN: [id number] Endoscopist: Mallie Mussel L. Loletha Carrow , MD Age: 79 Referring MD:  Date of Birth: 11/18/1940 Gender: Male Account #: [id number] Procedure:                Upper GI endoscopy Indications:              Epigastric abdominal pain, Abdominal bloating (pain                            radiating down left hemi-abdomen to hip) .                            Extensive lab, endoscopic and radiographic workup                            for same symptoms in 2014. Negative H pylori                            testing 2009 Medicines:                Monitored Anesthesia Care Procedure:                Pre-Anesthesia Assessment:                           - Prior to the procedure, a History and Physical                            was performed, and patient medications and                            allergies were reviewed. The patient's tolerance of                            previous anesthesia was also reviewed. The risks                            and benefits of the procedure and the sedation                            options and risks were discussed with the patient.                            All questions were answered, and informed consent                            was obtained. Prior Anticoagulants: The patient has                            taken no previous anticoagulant or antiplatelet                            agents except for aspirin. ASA Grade Assessment:  III - A patient with severe systemic disease. After                            reviewing the risks and benefits, the patient was                            deemed in satisfactory condition to undergo the                            procedure.                           After obtaining informed consent, the endoscope was                            passed under direct vision. Throughout the   procedure, the patient's blood pressure, pulse, and                            oxygen saturations were monitored continuously. The                            Endoscope was introduced through the mouth, and                            advanced to the second part of duodenum. The upper                            GI endoscopy was accomplished without difficulty.                            The patient tolerated the procedure well. Scope In: Scope Out: Findings:                 The esophagus was normal.                           Patchy inflammation characterized by adherent blood                            and erythema was found in the prepyloric region of                            the stomach. Biopsies were taken with a cold                            forceps for histology. (Sidney protocol).                           The exam of the stomach was otherwise normal,                            including on retroflexion.  Patchy mild inflammation characterized by erythema                            was found in the duodenal bulb.                           The exam of the duodenum was otherwise normal. Complications:            No immediate complications. Estimated Blood Loss:     Estimated blood loss was minimal. Impression:               - Normal esophagus.                           - Gastritis. Biopsied.                           - Duodenitis.                           Endoscopic findings felt likely to be the result of                            chronic aspirin use, and not likely causing the                            constellation of symptoms reported by patient.                           Suspected to be at least in part from aerophagia                            related to CPAP use. Recommendation:           - Patient has a contact number available for                            emergencies. The signs and symptoms of potential                            delayed  complications were discussed with the                            patient. Return to normal activities tomorrow.                            Written discharge instructions were provided to the                            patient.                           - Resume previous diet.                           - Continue present medications.                           -  Await pathology results. Treat if H. pylori                            positive. Samaan L. Myrtie Neither, MD 11/10/2019 10:32:22 AM This report has been signed electronically.

## 2019-11-11 ENCOUNTER — Ambulatory Visit: Payer: Medicare Other | Attending: Internal Medicine

## 2019-11-11 DIAGNOSIS — Z23 Encounter for immunization: Secondary | ICD-10-CM | POA: Insufficient documentation

## 2019-11-14 ENCOUNTER — Telehealth: Payer: Self-pay

## 2019-11-14 NOTE — Telephone Encounter (Signed)
  Follow up Call-  Call back number 11/10/2019  Post procedure Call Back phone  # [phone]  Permission to leave phone message Yes  Some recent data might be hidden     Patient questions:  Do you have a fever, pain , or abdominal swelling? No. Pain Score  0 *  Have you tolerated food without any problems? Yes.    Have you been able to return to your normal activities? Yes.    Do you have any questions about your discharge instructions: Diet   No. Medications  No. Follow up visit  No.  Do you have questions or concerns about your Care? No.  Actions: * If pain score is 4 or above: No action needed, pain <4. 1. Have you developed a fever since your procedure? no  2.   Have you had an respiratory symptoms (SOB or cough) since your procedure? no  3.   Have you tested positive for COVID 19 since your procedure no  4.   Have you had any family members/close contacts diagnosed with the COVID 19 since your procedure?  no   If yes to any of these questions please route to Laverna Peace, RN and Jennye Boroughs, Charity fundraiser.

## 2019-11-20 ENCOUNTER — Encounter: Payer: Self-pay | Admitting: Gastroenterology

## 2019-11-30 ENCOUNTER — Other Ambulatory Visit: Payer: Self-pay | Admitting: Interventional Radiology

## 2019-11-30 DIAGNOSIS — I739 Peripheral vascular disease, unspecified: Secondary | ICD-10-CM

## 2019-12-09 ENCOUNTER — Ambulatory Visit: Payer: Medicare Other

## 2019-12-16 ENCOUNTER — Ambulatory Visit: Payer: Medicare Other | Attending: Internal Medicine

## 2019-12-16 DIAGNOSIS — Z23 Encounter for immunization: Secondary | ICD-10-CM | POA: Insufficient documentation

## 2019-12-16 NOTE — Progress Notes (Signed)
   [facility]  Name:  Frederick Cisneros.    MRN: [id number] DOB: 05/28/1941  12/16/2019  Frederick Cisneros was observed post Covid-19 immunization for 15 minutes without incidence. He was provided with Vaccine Information Sheet and instruction to access the V-Safe system.   Frederick Cisneros was instructed to call 911 with any severe reactions post vaccine: Marland Kitchen Difficulty breathing  . Swelling of your face and throat  . A fast heartbeat  . A bad rash all over your body  . Dizziness and weakness    Immunizations Administered    Name Date Dose VIS Date Route   Moderna COVID-19 Vaccine 12/16/2019  9:37 AM 0.5 mL 09/19/2019 Intramuscular   Manufacturer: Moderna   Lot: [id number]   NDC: 19957-900-92

## 2019-12-28 ENCOUNTER — Ambulatory Visit
Admission: RE | Admit: 2019-12-28 | Discharge: 2019-12-28 | Disposition: A | Discharge status: Home / Self Care | Payer: Medicare Other | Source: Ambulatory Visit | Attending: Interventional Radiology | Admitting: Interventional Radiology

## 2019-12-28 ENCOUNTER — Encounter: Payer: Self-pay | Admitting: *Deleted

## 2019-12-28 ENCOUNTER — Other Ambulatory Visit: Payer: Self-pay

## 2019-12-28 DIAGNOSIS — I739 Peripheral vascular disease, unspecified: Secondary | ICD-10-CM

## 2019-12-28 HISTORY — PX: IR RADIOLOGIST EVAL & MGMT: IMG5224

## 2019-12-28 NOTE — Progress Notes (Signed)
Chief Complaint: Peripheral Vascular Disease  Referring Physician(s): Dr. Shirline Frees  History of Present Illness: Frederick Cisnerosis a 79 y.o.malepresenting today to [facility] as a scheduled follow-up for his peripheral arterial disease, SP treatment of Rutherford 3 class symptoms with prior bare-metal stenting of a flow-limiting lesion of the left iliac artery, 04/11/2015.   He arrives today with no new complaints.   Since previous, the [employer] has closed to usual members, given the [place] situation.  He says that over the winter he was unable to work-out as much as he likes, but now that the weather is warming, he walks at least every other day at a near-by park for a few miles.  He does this comfortably without any recurrent LE symptoms.   He does tell me that occasionally he has some pain at his left hip.  This is also sometimes present when he gets from a seated position to a standing position and starts walking.  I cannot elicit any typical history of resting pain.   He continues maximal medical therapy.   He has had repeat non-invasive exam performed today, which is essentially unchanged from his prior non-invasive study of 1 year ago.   Right ABI is 0.86 Left ABI is 0.92.  The segmental exam shows no significant disease bilateral to the ankles.  Possibly small vessel disease developing in the feet.     Past Medical History:  Diagnosis Date  . Arthritis   . BPH (benign prostatic hyperplasia)   . CAD (coronary artery disease)   . Diverticulitis   . Dyslipidemia   . Esophageal reflux   . Glaucoma    per pt  . Hiatal hernia   . Hypertension   . LVH (left ventricular hypertrophy)   . Sleep apnea   . Type II or unspecified type diabetes mellitus without mention of complication, not stated as uncontrolled     Past Surgical History:  Procedure Laterality Date  . CARDIAC CATHETERIZATION    . CAROTID ENDARTERECTOMY    .  COLONOSCOPY    . IR GENERIC HISTORICAL  04/29/2016   IR RADIOLOGIST EVAL & MGMT 04/29/2016 Corrie Mckusick, DO [facility] INTERV RAD  . IR RADIOLOGIST EVAL & MGMT  12/22/2016  . IR RADIOLOGIST EVAL & MGMT  12/23/2017  . IR RADIOLOGIST EVAL & MGMT  12/14/2018  . IR RADIOLOGIST EVAL & MGMT  12/28/2019  . ROTATOR CUFF REPAIR     right  . STOMACH SURGERY    . TRANSURETHRAL RESECTION OF PROSTATE      Allergies: Patient has no known allergies.  Medications: Prior to Admission medications   Medication Sig Start Date End Date Taking? Authorizing Provider  amLODipine (NORVASC) 5 MG tablet Take 5 mg by mouth daily.     [provider]  aspirin 325 MG tablet Take 325 mg by mouth daily.    [provider]  clidinium-chlordiazePOXIDE (LIBRAX) 2.5-5 MG per capsule Take 1 capsule by mouth 2 (two) times daily as needed. 12/13/12   Sable Feil, MD  colchicine 0.6 MG tablet Take 0.6 mg by mouth 2 (two) times daily as needed. 09/29/19   [provider]  dorzolamide-timolol (COSOPT) 22.3-6.8 MG/ML ophthalmic solution  08/22/19   [provider]  ketoconazole (NIZORAL) 2 % cream Apply 1 application topically daily as needed for irritation (itching). Reported on 11/05/2015 08/08/13   [provider]  lisinopril (PRINIVIL,ZESTRIL) 10 MG tablet Take 10 mg by mouth daily.  08/29/13  [provider]  metoprolol succinate (TOPROL-XL) 100 MG 24 hr tablet Take 100 mg by mouth daily. Take with or immediately following a meal.    [provider]  Multiple Vitamin (MULTIVITAMIN WITH MINERALS) TABS tablet Take 1 tablet by mouth daily.    [provider]  OVER THE COUNTER MEDICATION Place 1 drop into both eyes daily as needed (dry eyes/ irritation). Over the counter eye drop    [provider]  pantoprazole (PROTONIX) 40 MG tablet Take 40 mg by mouth 2 (two) times daily.    [provider]  rosuvastatin (CRESTOR) 40 MG tablet Take 40 mg by  mouth daily. Reported on 11/05/2015    [provider]  sucralfate (CARAFATE) 1 GM/10ML suspension One tablespoon after meals and at bedtime Patient not taking: Reported on 12/14/2018 09/26/13   Mardella Layman, MD  Travoprost, BAK Free, (TRAVATAN) 0.004 % SOLN ophthalmic solution  08/22/19   [provider]     Family History  Problem Relation Age of Onset  . Breast cancer Sister   . Diabetes Sister        x 3  . GI problems Father        type unknown  . Colon cancer Neg Hx   . Esophageal cancer Neg Hx   . Stomach cancer Neg Hx   . Rectal cancer Neg Hx     Social History   Socioeconomic History  . Marital status: Married    Spouse name: Not on file  . Number of children: 4  . Years of education: Not on file  . Highest education level: Not on file  Occupational History  . Occupation: retired  Tobacco Use  . Smoking status: Former Smoker    Quit date: 10/19/1988    Years since quitting: 31.2  . Smokeless tobacco: Never Used  Substance and Sexual Activity  . Alcohol use: Yes    Comment: occasional wine  . Drug use: No  . Sexual activity: Not on file  Other Topics Concern  . Not on file  Social History Narrative  . Not on file   Social Determinants of Health   Financial Resource Strain:   . Difficulty of Paying Living Expenses:   Food Insecurity:   . Worried About Programme researcher, broadcasting/film/video in the Last Year:   . Barista in the Last Year:   Transportation Needs:   . Freight forwarder (Medical):   Marland Kitchen Lack of Transportation (Non-Medical):   Physical Activity:   . Days of Exercise per Week:   . Minutes of Exercise per Session:   Stress:   . Feeling of Stress :   Social Connections:   . Frequency of Communication with Friends and Family:   . Frequency of Social Gatherings with Friends and Family:   . Attends Religious Services:   . Active Member of Clubs or Organizations:   . Attends Banker Meetings:   Marland Kitchen Marital Status:         Review of Systems: A 12 point ROS discussed and pertinent positives are indicated in the HPI above.  All other systems are negative.  Review of Systems  Vital Signs: BP (!) 175/69 (BP Location: Left Arm)   Pulse 63   Temp 98 F (36.7 C)   SpO2 98%   Physical Exam General: 79 yo AA male appearing stated age.  Well-developed, well-nourished.  No distress. HEENT: Atraumatic, normocephalic.  Conjugate gaze, extra-ocular motor intact. Glasses. No  scleral icterus or scleral injection. No lesions on external ears, nose, lips, or gums.  Oral mucosa moist, pink.  Neck: Symmetric with no goiter enlargement.  Chest/Lungs:  Symmetric chest with inspiration/expiration.  No labored breathing.     Heart:    No JVD appreciated.  Abdomen:  Soft, NT/ND, with + bowel sounds.   Genito-urinary: Deferred Neurologic: Alert & Oriented to person, place, and time.   Normal affect and insight.  Appropriate questions.  Moving all 4 extremities with gross sensory intact.  Pulse Exam:   Strong doppler positive right AT & PT.  Strong doppler positive left AT & PT.  Extremities: No wound.  He has a well healed scar on the dorsum of the right forefoot.  Dry hairless skin bilateral.   No intertriginous wounds.   Imaging: IR Radiologist Eval & Mgmt  Result Date: 12/28/2019 Please refer to notes tab for details about interventional procedure. (Op Note)   Labs:  CBC: No results for input(s): WBC, HGB, HCT, PLT in the last 8760 hours.  COAGS: No results for input(s): INR, APTT in the last 8760 hours.  BMP: No results for input(s): NA, K, CL, CO2, GLUCOSE, BUN, CALCIUM, CREATININE, GFRNONAA, GFRAA in the last 8760 hours.  Invalid input(s): CMP  LIVER FUNCTION TESTS: No results for input(s): BILITOT, AST, ALT, ALKPHOS, PROT, ALBUMIN in the last 8760 hours.  TUMOR MARKERS: No results for input(s): AFPTM, CEA, CA199, CHROMGRNA in the last 8760 hours.   Assessment:  Frederick Cisneros is a very pleasant 79  yo male presenting for follow up of his treated left iliac TASC A PAD almost 5 years ago, with complete and durable resolution of his symptoms.   Non-invasive lower extremity exam shows normal range left ABI, and mild range right ABI.   I encouraged Frederick Cisneros to continue his exercise routine and to maintain healthy diet.  I did let him know that there is no reason to be aggressive with any invasive procedures given the absence of symptoms.   I also let him know that the episodic left hip pain/back pain, and left ankle pain are unlikely to be vascular, and that perhaps a discussion with his PCP might be useful to determine any need for further evaluation.  At this time, he tells me that these symptoms are not very intrusive to his lifestyle. .  Given the presence of diabetes, healthy foot care was also discussed, in accord with multi-disciplinary, Class 1 recommendations.1,3 Current recommendations advocate daily foot inspection, good nail care, avoiding barefoot walking, properly fitted footwear, seeking care with problems, and consideration of initiating routine podiatric care with at least annual inspection3.     Regarding medical management, maximal medical therapy for reduction of risk factors is indicated as recommended by updated AHA guidelines1.  This includes anti-platelet medication, tight blood glucose control to a HbA1c < 7, tight blood pressure control, maximum-dose HMG-CoA reductase inhibitor, and smoking cessation.     Annual flu vaccination is also recommended, with Class 1 recommendation1.   Plan: - 1 year follow up visit with repeat non-invasive ABI/segmental exam  - continue maximal medical therapy for cardiovascular risk reduction, including anti-platelet therapy.  - Healthy foot care habits, given the presence of diabetes, with at least annual foot inspection performed in the setting of DM.     ___________________________________________________________________   1Monte Fantasia MD, et al. 2016 AHA/ACC Guideline on the Management of Patients With Lower Extremity Peripheral Artery Disease: Executive Summary: A Report of the [employer]  of Cardiology/American Heart Association Task Force on Clinical Practice Guidelines. J Am Coll Cardiol. 2017 Mar 21;69(11):1465-1508. doi: 10.1016/j.jacc.2016.11.008.   2 - Norgren L, et al. TASC II Working Group. Inter-society consensus for the management of peripheral arterial disease. Int Nunzio Cobbs. 2007 Jun;26(2):81-157. Review. PubMed PMID: 55732202  3 - Hingorani A, et al. The management of diabetic foot: A clinical practice guideline by the Society for Vascular Surgery in collaboration with the American Podiatric Medical Association and the Society  for Vascular Medicine. J Vasc Surg. 2016 Feb;63(2 Suppl):3S-21S. doi: 10.1016/j.jvs.2015.10.003. PubMed PMID: 54270623.  4 - Luther Hearing, Saab FA, Elyse Jarvis, Danae Orleans, Deeann Cree, Driver VR, Passapatanzy, Lookstein R, van den Tilman Neat, Jaff Frederick, Reinaldo Raddle, Henao S, AlMahameed A, Katzen B. Digital Subtraction Angiography Prior to an Amputation for Critical Limb Ischemia (CLI): An Expert Recommendation Statement From the CLI Global Society to Optimize Limb Salvage. J Endovasc Ther. 2020 Aug;27(4):540-546. doi: 10.1177/1526602820928590. Epub 2020 May 29. PMID: 76283151.       Electronically Signed: Gilmer Cisneros 12/28/2019, 11:35 AM   I spent a total of    25 Minutes in face to face in clinical consultation, greater than 50% of which was counseling/coordinating care for PAD, treated left iliac disease

## 2020-11-24 ENCOUNTER — Emergency Department (HOSPITAL_COMMUNITY): Payer: Medicare Other

## 2020-11-24 ENCOUNTER — Encounter (HOSPITAL_COMMUNITY): Payer: Self-pay | Admitting: Obstetrics and Gynecology

## 2020-11-24 ENCOUNTER — Other Ambulatory Visit: Payer: Self-pay

## 2020-11-24 ENCOUNTER — Emergency Department (HOSPITAL_COMMUNITY)
Admission: EM | Admit: 2020-11-24 | Discharge: 2020-11-24 | Disposition: A | Discharge status: Home / Self Care | Payer: Medicare Other | Attending: Emergency Medicine | Admitting: Emergency Medicine

## 2020-11-24 DIAGNOSIS — M545 Low back pain, unspecified: Secondary | ICD-10-CM | POA: Insufficient documentation

## 2020-11-24 DIAGNOSIS — R109 Unspecified abdominal pain: Secondary | ICD-10-CM | POA: Diagnosis not present

## 2020-11-24 DIAGNOSIS — I2510 Atherosclerotic heart disease of native coronary artery without angina pectoris: Secondary | ICD-10-CM | POA: Diagnosis not present

## 2020-11-24 DIAGNOSIS — E119 Type 2 diabetes mellitus without complications: Secondary | ICD-10-CM | POA: Diagnosis not present

## 2020-11-24 DIAGNOSIS — M549 Dorsalgia, unspecified: Secondary | ICD-10-CM | POA: Diagnosis present

## 2020-11-24 DIAGNOSIS — Z79899 Other long term (current) drug therapy: Secondary | ICD-10-CM | POA: Insufficient documentation

## 2020-11-24 DIAGNOSIS — Z87891 Personal history of nicotine dependence: Secondary | ICD-10-CM | POA: Insufficient documentation

## 2020-11-24 DIAGNOSIS — I10 Essential (primary) hypertension: Secondary | ICD-10-CM | POA: Insufficient documentation

## 2020-11-24 DIAGNOSIS — Z7982 Long term (current) use of aspirin: Secondary | ICD-10-CM | POA: Insufficient documentation

## 2020-11-24 LAB — COMPREHENSIVE METABOLIC PANEL
ALT: 29 U/L (ref 0–44)
AST: 33 U/L (ref 15–41)
Albumin: 4.5 g/dL (ref 3.5–5.0)
Alkaline Phosphatase: 55 U/L (ref 38–126)
Anion gap: 8 (ref 5–15)
BUN: 19 mg/dL (ref 8–23)
CO2: 24 mmol/L (ref 22–32)
Calcium: 9.9 mg/dL (ref 8.9–10.3)
Chloride: 107 mmol/L (ref 98–111)
Creatinine, Ser: 1.41 mg/dL — ABNORMAL HIGH (ref 0.61–1.24)
GFR, Estimated: 50 mL/min — ABNORMAL LOW (ref >60)
Glucose, Bld: 109 mg/dL — ABNORMAL HIGH (ref 70–99)
Potassium: 4.5 mmol/L (ref 3.5–5.1)
Sodium: 139 mmol/L (ref 135–145)
Total Bilirubin: 0.7 mg/dL (ref 0.3–1.2)
Total Protein: 7.9 g/dL (ref 6.5–8.1)

## 2020-11-24 LAB — CBC WITH DIFFERENTIAL/PLATELET
Abs Immature Granulocytes: 0 10*3/uL (ref 0.00–0.07)
Basophils Absolute: 0 10*3/uL (ref 0.0–0.1)
Basophils Relative: 1 %
Eosinophils Absolute: 0.2 10*3/uL (ref 0.0–0.5)
Eosinophils Relative: 3 %
HCT: 44.8 % (ref 39.0–52.0)
Hemoglobin: 15.2 g/dL (ref 13.0–17.0)
Immature Granulocytes: 0 %
Lymphocytes Relative: 44 %
Lymphs Abs: 2.3 10*3/uL (ref 0.7–4.0)
MCH: 31.1 pg (ref 26.0–34.0)
MCHC: 33.9 g/dL (ref 30.0–36.0)
MCV: 91.6 fL (ref 80.0–100.0)
Monocytes Absolute: 0.4 10*3/uL (ref 0.1–1.0)
Monocytes Relative: 8 %
Neutro Abs: 2.3 10*3/uL (ref 1.7–7.7)
Neutrophils Relative %: 44 %
Platelets: 156 10*3/uL (ref 150–400)
RBC: 4.89 MIL/uL (ref 4.22–5.81)
RDW: 14 % (ref 11.5–15.5)
WBC: 5.1 10*3/uL (ref 4.0–10.5)
nRBC: 0 % (ref 0.0–0.2)

## 2020-11-24 LAB — URINALYSIS, ROUTINE W REFLEX MICROSCOPIC
Bacteria, UA: NONE SEEN
Bilirubin Urine: NEGATIVE
Glucose, UA: NEGATIVE mg/dL
Hgb urine dipstick: NEGATIVE
Ketones, ur: NEGATIVE mg/dL
Leukocytes,Ua: NEGATIVE
Nitrite: NEGATIVE
Protein, ur: NEGATIVE mg/dL
Specific Gravity, Urine: 1.015 (ref 1.005–1.030)
pH: 5 (ref 5.0–8.0)

## 2020-11-24 LAB — LIPASE, BLOOD: Lipase: 42 U/L (ref 11–51)

## 2020-11-24 MED ORDER — OXYCODONE-ACETAMINOPHEN 5-325 MG PO TABS: 1.0000 | ORAL_TABLET | Freq: Four times a day (QID) | ORAL | 0 refills | Status: DC | PRN

## 2020-11-24 MED ORDER — OXYCODONE-ACETAMINOPHEN 5-325 MG PO TABS
1.0000 | ORAL_TABLET | Freq: Once | ORAL | Status: AC
Start: 2020-11-24 — End: 2020-11-24
  Administered 2020-11-24: 1 via ORAL
  Filled 2020-11-24: qty 1

## 2020-11-24 NOTE — Discharge Instructions (Signed)
Your workup was reassuring today. Your pain is likely related to a muscle strain. Pick up medication and take as needed for severe pain. You can also apply heat to the area to help with muscle relaxation/massage the muscle.  Please follow up with your PCP regarding your ED visit today Return to the ED for any worsening symptoms

## 2020-11-24 NOTE — ED Triage Notes (Signed)
Patient reports left sided back pain that radiates down to his left hip/thigh.

## 2020-11-24 NOTE — ED Provider Notes (Signed)
[facility]-EMERGENCY DEPT Provider Note   CSN: [id number] Arrival date & time: 11/24/20  7672     History Chief Complaint  Patient presents with  . Back Pain    Frederick Cisneros. is a 80 y.o. male with PMHx HTN, Diabetes, CAD, Diverticulitis who presents to the ED today with complaint of gradual onset, intermittent, dull/achy, left flank pain radiating into left groin x 1 week. Pt reports the pain seems to be worse when he goes from a seated position to a standing position. He has been taking Tylenol OTC with mild relief. He denies any radiation of pain down the back of his LLE. No weakness/numbness. Denies fevers, chills, urinary retention, urinary or bowel incontinence, saddle anesthesia, dysuria, hematuria, or any other associated symptoms.   The history is provided by the patient and medical records.       Past Medical History:  Diagnosis Date  . Arthritis   . BPH (benign prostatic hyperplasia)   . CAD (coronary artery disease)   . Diverticulitis   . Dyslipidemia   . Esophageal reflux   . Glaucoma    per pt  . Hiatal hernia   . Hypertension   . LVH (left ventricular hypertrophy)   . Sleep apnea   . Type II or unspecified type diabetes mellitus without mention of complication, not stated as uncontrolled     Patient Active Problem List   Diagnosis Date Noted  . PAD (peripheral artery disease) (HCC)   . GERD (gastroesophageal reflux disease) 12/25/2011  . Rectal or anal pain 12/25/2011  . Hemorrhage of rectum and anus 11/27/2011  . LUQ pain 11/27/2011  . Colitis, nonspecific 11/27/2011  . Gastritis 11/27/2011  . Rectal bleeding 11/25/2011  . Diverticulosis of colon (without mention of hemorrhage) 11/25/2011  . Guaiac + stool 11/25/2011  . Abdominal pain, left upper quadrant 11/25/2011  . DIABETES MELLITUS 09/17/2009  . HYPERCHOLESTEROLEMIA 09/17/2009  . HYPERTENSION 09/17/2009  . CAD 09/17/2009  . VENTRICULAR HYPERTROPHY, LEFT 09/17/2009   . 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 GERD 09/17/2009  . ARTHRITIS 09/17/2009  . ABDOMINAL PAIN-EPIGASTRIC 09/17/2009    Past Surgical History:  Procedure Laterality Date  . CARDIAC CATHETERIZATION    . CAROTID ENDARTERECTOMY    . COLONOSCOPY    . IR GENERIC HISTORICAL  04/29/2016   IR RADIOLOGIST EVAL & MGMT 04/29/2016 [name], DO [facility] INTERV RAD  . IR RADIOLOGIST EVAL & MGMT  12/22/2016  . IR RADIOLOGIST EVAL & MGMT  12/23/2017  . IR RADIOLOGIST EVAL & MGMT  12/14/2018  . IR RADIOLOGIST EVAL & MGMT  12/28/2019  . ROTATOR CUFF REPAIR     right  . STOMACH SURGERY    . TRANSURETHRAL RESECTION OF PROSTATE         Family History  Problem Relation Age of Onset  . Breast cancer Sister   . Diabetes Sister        x 3  . GI problems Father        type unknown  . Colon cancer Neg Hx   . Esophageal cancer Neg Hx   . Stomach cancer Neg Hx   . Rectal cancer Neg Hx     Social History   Tobacco Use  . Smoking status: Former Smoker    Quit date: 10/19/1988    Years since quitting: 32.1  . Smokeless tobacco: Never Used  Vaping Use  . Vaping Use: Never used  Substance Use Topics  . Alcohol use: Yes  Comment: occasional wine  . Drug use: No    Home Medications Prior to Admission medications   Medication Sig Start Date End Date Taking? Authorizing Provider  oxyCODONE-acetaminophen (PERCOCET/ROXICET) 5-325 MG tablet Take 1 tablet by mouth every 6 (six) hours as needed for severe pain. 11/24/20  Yes Anabelen Kaminsky, PA-C  amLODipine (NORVASC) 5 MG tablet Take 5 mg by mouth daily.     [provider]  aspirin 325 MG tablet Take 325 mg by mouth daily.    [provider]  clidinium-chlordiazePOXIDE (LIBRAX) 2.5-5 MG per capsule Take 1 capsule by mouth 2 (two) times daily as needed. 12/13/12   Mardella Layman, MD  colchicine 0.6 MG tablet Take 0.6 mg by mouth 2 (two) times daily as needed. 09/29/19   [provider]  dorzolamide-timolol (COSOPT)  22.3-6.8 MG/ML ophthalmic solution  08/22/19   [provider]  ketoconazole (NIZORAL) 2 % cream Apply 1 application topically daily as needed for irritation (itching). Reported on 11/05/2015 08/08/13   [provider]  lisinopril (PRINIVIL,ZESTRIL) 10 MG tablet Take 10 mg by mouth daily.  08/29/13   [provider]  metoprolol succinate (TOPROL-XL) 100 MG 24 hr tablet Take 100 mg by mouth daily. Take with or immediately following a meal.    [provider]  Multiple Vitamin (MULTIVITAMIN WITH MINERALS) TABS tablet Take 1 tablet by mouth daily.    [provider]  OVER THE COUNTER MEDICATION Place 1 drop into both eyes daily as needed (dry eyes/ irritation). Over the counter eye drop    [provider]  pantoprazole (PROTONIX) 40 MG tablet Take 40 mg by mouth 2 (two) times daily.    [provider]  rosuvastatin (CRESTOR) 40 MG tablet Take 40 mg by mouth daily. Reported on 11/05/2015    [provider]  sucralfate (CARAFATE) 1 GM/10ML suspension One tablespoon after meals and at bedtime Patient not taking: Reported on 12/14/2018 09/26/13   Mardella Layman, MD  Travoprost, BAK Free, (TRAVATAN) 0.004 % SOLN ophthalmic solution  08/22/19   [provider]    Allergies    Patient has no known allergies.  Review of Systems   Review of Systems  Constitutional: Negative for chills and fever.  Gastrointestinal: Negative for abdominal pain, diarrhea, nausea and vomiting.  Genitourinary: Positive for flank pain.  Musculoskeletal: Positive for back pain.  All other systems reviewed and are negative.   Physical Exam Updated Vital Signs BP (!) 175/71 (BP Location: Left Arm)   Pulse 67   Temp 97.9 F (36.6 C) (Oral)   Resp 16   Ht 5' 5.5" (1.664 m)   Wt 72.6 kg   SpO2 99%   BMI 26.22 kg/m   Physical Exam Vitals and nursing note reviewed.  Constitutional:      Appearance: He is not ill-appearing.  HENT:      Head: Normocephalic and atraumatic.  Eyes:     Conjunctiva/sclera: Conjunctivae normal.  Cardiovascular:     Rate and Rhythm: Normal rate and regular rhythm.     Pulses: Normal pulses.  Pulmonary:     Effort: Pulmonary effort is normal.     Breath sounds: Normal breath sounds. No wheezing, rhonchi or rales.  Abdominal:     Tenderness: There is abdominal tenderness. There is left CVA tenderness. There is no right CVA tenderness, guarding or rebound.     Comments: Soft, + left CVA/flank TTP with mild LLQ TTP, +BS throughout, no r/g/r, neg murphy's, neg mcburney's  Musculoskeletal:     Comments: No C, T, or L midline spinal TTP. No obvious left paramusculature TTP. ROM intact to neck and back. Strength 5/5 to BUE and BLEs. Strength 5/5 with hip flexion, knee flexion/extension, dorsiflexion/plantarfelxion  Bilaterally. Negative SLR bilaterally. 2+ PT pulses bilaterally.   Skin:    General: Skin is warm and dry.     Coloration: Skin is not jaundiced.  Neurological:     Mental Status: He is alert.     ED Results / Procedures / Treatments   Labs (all labs ordered are listed, but only abnormal results are displayed) Labs Reviewed  COMPREHENSIVE METABOLIC PANEL - Abnormal; Notable for the following components:      Result Value   Glucose, Bld 109 (*)    Creatinine, Ser 1.41 (*)    GFR, Estimated 50 (*)    All other components within normal limits  URINALYSIS, ROUTINE W REFLEX MICROSCOPIC  CBC WITH DIFFERENTIAL/PLATELET  LIPASE, BLOOD    EKG None  Radiology CT Renal Stone Study  Result Date: 11/24/2020 CLINICAL DATA:  Back pain. EXAM: CT ABDOMEN AND PELVIS WITHOUT CONTRAST TECHNIQUE: Multidetector CT imaging of the abdomen and pelvis was performed following the standard protocol without IV contrast. COMPARISON:  December 03, 2017 FINDINGS: Lower chest: Calcified aortic valve annulus. Hepatobiliary: No focal liver abnormality is seen. No gallstones, gallbladder wall thickening, or  biliary dilatation. Pancreas: Unremarkable. No pancreatic ductal dilatation or surrounding inflammatory changes. Spleen: Normal in size without focal abnormality. Adrenals/Urinary Tract: Adrenal glands are unremarkable. Kidneys are normal, without renal calculi, focal lesion, or hydronephrosis. Bladder is unremarkable. Stomach/Bowel: Stomach is within normal limits. Appendix appears normal. No evidence of bowel wall thickening, distention, or inflammatory changes. Extensive diverticulosis of the sigmoid colon without evidence of acute diverticulitis. Vascular/Lymphatic: Aortic atherosclerosis. No enlarged abdominal or pelvic lymph nodes. No evidence of aneurysmal dilation of the aorta. Reproductive: Moderately enlarged prostate gland measures 5.4 cm transversely. Other: Right mostly fat containing inguinal hernia which contains a small portion of the right lateral aspect of the urinary bladder. Musculoskeletal: No acute or significant osseous findings. IMPRESSION: 1. No evidence of acute abnormalities within the solid abdominal organs. 2. Extensive diverticulosis of the sigmoid colon without evidence of acute diverticulitis. 3. Right mostly fat containing inguinal hernia which contains a small portion of the right lateral aspect of the urinary bladder. 4. Moderately enlarged prostate gland. Please correlate to serum PSA values. 5. Calcified aortic valve annulus. 6. Aortic atherosclerosis without evidence of aneurysmal dilation. Aortic Atherosclerosis (ICD10-I70.0). Electronically Signed   By: Ted Mcalpine M.D.   On: 11/24/2020 14:10    Procedures Procedures   Medications Ordered in ED Medications  oxyCODONE-acetaminophen (PERCOCET/ROXICET) 5-325 MG per tablet 1 tablet (1 tablet Oral Given 11/24/20 1231)    ED Course  I have reviewed the triage vital signs and the nursing notes.  Pertinent labs & imaging results that were available during my care of the patient were reviewed by me and considered in  my medical decision making (see chart for details).    MDM Rules/Calculators/A&P                          80 year old male who presents to the ED today with complaint of atraumatic left flank pain radiating into his left groin x 1 week gradually worsening, no other symptoms at this time. No history of same. Has been taking Tylenol without relief. On arrival to the ED VSS. Pt appears  to be in NAD. He was triaged as back pain however his pain seems to be stemming from his flank/CVA area on the left side. He denies any radiation of pain to the posterior aspect of his LLE. He is neurovascularly intact throughout with positive SLR. Pt does have a history of diverticulitis and question is this is causing pain vs kidney stone vs AAA vs musculoskeletal pain. Given elderly age and the fact that pt does not frequently come to the ED will plan for U/A and labs. Will likely obtain CT scan to assess for size of pt's aorta as well.   CBC without leukocytosis. Hgb stable at 15.2.  CMP with creatinine 1.41 which is around pt's baseline. No other electrolyte abnormalities Lipase 42 U/A clear without signs of infection or hgb.   CT scan IMPRESSION:  1. No evidence of acute abnormalities within the solid abdominal  organs.  2. Extensive diverticulosis of the sigmoid colon without evidence of  acute diverticulitis.  3. Right mostly fat containing inguinal hernia which contains a  small portion of the right lateral aspect of the urinary bladder.  4. Moderately enlarged prostate gland. Please correlate to serum PSA  values.  5. Calcified aortic valve annulus.  6. Aortic atherosclerosis without evidence of aneurysmal dilation.   Will treat for musculoskeletal pain at this time. Attending physician Dr. Fredderick Phenix has evaluated patient and agrees with plan.   This note was prepared using Dragon voice recognition software and may include unintentional dictation errors due to the inherent limitations of voice  recognition software.  Final Clinical Impression(s) / ED Diagnoses Final diagnoses:  Acute left-sided low back pain without sciatica    Rx / DC Orders ED Discharge Orders         Ordered    oxyCODONE-acetaminophen (PERCOCET/ROXICET) 5-325 MG tablet  Every 6 hours PRN        11/24/20 1429           Discharge Instructions     Your workup was reassuring today. Your pain is likely related to a muscle strain. Pick up medication and take as needed for severe pain. You can also apply heat to the area to help with muscle relaxation/massage the muscle.  Please follow up with your PCP regarding your ED visit today Return to the ED for any worsening symptoms       Tanda Rockers, PA-C 11/24/20 1432    Rolan Bucco, MD 12/05/20 [phone]

## 2020-12-24 ENCOUNTER — Other Ambulatory Visit: Payer: Self-pay | Admitting: Interventional Radiology

## 2020-12-24 DIAGNOSIS — I739 Peripheral vascular disease, unspecified: Secondary | ICD-10-CM

## 2021-01-15 ENCOUNTER — Ambulatory Visit
Admission: RE | Admit: 2021-01-15 | Discharge: 2021-01-15 | Disposition: A | Discharge status: Home / Self Care | Payer: Medicare Other | Source: Ambulatory Visit | Attending: Interventional Radiology | Admitting: Interventional Radiology

## 2021-01-15 ENCOUNTER — Encounter: Payer: Self-pay | Admitting: *Deleted

## 2021-01-15 DIAGNOSIS — I739 Peripheral vascular disease, unspecified: Secondary | ICD-10-CM

## 2021-01-15 HISTORY — PX: IR RADIOLOGIST EVAL & MGMT: IMG5224

## 2021-01-15 NOTE — Progress Notes (Signed)
Chief Complaint: Peripheral Vascular Disease  Referring Physician(s): Dr. Johny Cisneros  History of Present Illness: Frederick Cisneros a 80 y.o.malepresenting today to [facility] as a scheduled follow-up for his peripheral arterial disease, SP treatment of Rutherford 3 class symptoms with prior bare-metal stenting of a flow-limiting lesion of the left iliac artery, 04/11/2015.   He arrives today with no new complaints.  Since we saw him 12/28/19, he tells me that he had started back going to the [facility] gym, however, he has not been since mid January.  This is because [place]  He has purchased an "E-bike" so that he can go there independently.   He is able to perform all of his ADL's comfortably, and has no claudication.   He has had no new wound.   He continues maximal medical therapy.   He has had repeat non-invasive exam performedtoday, which is essentially unchanged from his prior non-invasive study of 1 year ago.   Right ABI is 0.84 Left ABI is 0.88 The segmental exam shows no significant fem-pop disease.  Questionable developing tibial disease.    Past Medical History:  Diagnosis Date  . Arthritis   . BPH (benign prostatic hyperplasia)   . CAD (coronary artery disease)   . Diverticulitis   . Dyslipidemia   . Esophageal reflux   . Glaucoma    per pt  . Hiatal hernia   . Hypertension   . LVH (left ventricular hypertrophy)   . Sleep apnea   . Type II or unspecified type diabetes mellitus without mention of complication, not stated as uncontrolled     Past Surgical History:  Procedure Laterality Date  . CARDIAC CATHETERIZATION    . CAROTID ENDARTERECTOMY    . COLONOSCOPY    . IR GENERIC HISTORICAL  04/29/2016   IR RADIOLOGIST EVAL & MGMT 04/29/2016 Frederick Mor, DO [facility] INTERV RAD  . IR RADIOLOGIST EVAL & MGMT  12/22/2016  . IR RADIOLOGIST EVAL & MGMT  12/23/2017  . IR RADIOLOGIST  EVAL & MGMT  12/14/2018  . IR RADIOLOGIST EVAL & MGMT  12/28/2019  . IR RADIOLOGIST EVAL & MGMT  01/15/2021  . ROTATOR CUFF REPAIR     right  . STOMACH SURGERY    . TRANSURETHRAL RESECTION OF PROSTATE      Allergies: Patient has no known allergies.  Medications: Prior to Admission medications   Medication Sig Start Date End Date Taking? Authorizing Provider  amLODipine (NORVASC) 5 MG tablet Take 5 mg by mouth daily.     [provider]  aspirin 325 MG tablet Take 325 mg by mouth daily.    [provider]  clidinium-chlordiazePOXIDE (LIBRAX) 2.5-5 MG per capsule Take 1 capsule by mouth 2 (two) times daily as needed. 12/13/12   Frederick Layman, MD  colchicine 0.6 MG tablet Take 0.6 mg by mouth 2 (two) times daily as needed. 09/29/19   [provider]  dorzolamide-timolol (COSOPT) 22.3-6.8 MG/ML ophthalmic solution  08/22/19   [provider]  ketoconazole (NIZORAL) 2 % cream Apply 1 application topically daily as needed for irritation (itching). Reported on 11/05/2015 08/08/13   [provider]  lisinopril (PRINIVIL,ZESTRIL) 10 MG tablet Take 10 mg by mouth daily.  08/29/13   [provider]  metoprolol succinate (TOPROL-XL) 100 MG 24 hr tablet Take 100 mg by mouth daily. Take with or immediately following a meal.    [provider]  Multiple Vitamin (MULTIVITAMIN WITH MINERALS) TABS tablet Take 1 tablet by mouth daily.    [provider]  OVER THE COUNTER MEDICATION Place 1 drop into both eyes daily as needed (dry eyes/ irritation). Over the counter eye drop    [provider]  oxyCODONE-acetaminophen (PERCOCET/ROXICET) 5-325 MG tablet Take 1 tablet by mouth every 6 (six) hours as needed for severe pain. 11/24/20   Frederick Cisneros, Margaux, PA-C  pantoprazole (PROTONIX) 40 MG tablet Take 40 mg by mouth 2 (two) times daily.    [provider]  rosuvastatin (CRESTOR) 40 MG tablet Take 40 mg by mouth daily. Reported on  11/05/2015    [provider]  sucralfate (CARAFATE) 1 GM/10ML suspension One tablespoon after meals and at bedtime Patient not taking: Reported on 12/14/2018 09/26/13   Frederick LaymanPatterson, Frederick R, MD  Travoprost, BAK Free, (TRAVATAN) 0.004 % SOLN ophthalmic solution  08/22/19   [provider]     Family History  Problem Relation Age of Onset  . Breast cancer Sister   . Diabetes Sister        x 3  . GI problems Father        type unknown  . Colon cancer Neg Hx   . Esophageal cancer Neg Hx   . Stomach cancer Neg Hx   . Rectal cancer Neg Hx     Social History   Socioeconomic History  . Marital status: Married    Spouse name: Not on file  . Number of children: 4  . Years of education: Not on file  . Highest education level: Not on file  Occupational History  . Occupation: retired  Tobacco Use  . Smoking status: Former Smoker    Quit date: 10/19/1988    Years since quitting: 32.2  . Smokeless tobacco: Never Used  Vaping Use  . Vaping Use: Never used  Substance and Sexual Activity  . Alcohol use: Yes    Comment: occasional wine  . Drug use: No  . Sexual activity: Not on file  Other Topics Concern  . Not on file  Social History Narrative  . Not on file   Social Determinants of Health   Financial Resource Strain: Not on file  Food Insecurity: Not on file  Transportation Needs: Not on file  Physical Activity: Not on file  Stress: Not on file  Social Connections: Not on file       Review of Systems: A 12 point ROS discussed and pertinent positives are indicated in the HPI above.  All other systems are negative.  Review of Systems  Vital Signs: There were no vitals taken for this visit.  Physical Exam  General: [age] yo AA male appearing stated age.  Well-developed, well-nourished.  No distress. HEENT: Atraumatic, normocephalic.  Glasses. Conjugate gaze, extra-ocular motor intact. No scleral icterus or scleral injection. No lesions on external ears, nose,  lips, or gums.  Oral mucosa moist, pink.  Neck: Symmetric with no goiter enlargement.  Chest/Lungs:  Symmetric chest with inspiration/expiration.  No labored breathing.    Heart:    No JVD appreciated.  Abdomen:  Soft, NT/ND, with + bowel sounds.   Genito-urinary: Deferred Neurologic: Alert & Oriented to person, place, and time.   Normal affect and insight.  Appropriate questions.  Moving all 4 extremities with gross sensory intact.  Pulse Exam:  Doppler positive signal at the bilateral PT and DP.  Extremities: No wounds.      Imaging: US ARTERIAL SEG MULTIPLE LE (ABI, SEGMENTAL  PRESSURES, PVR'S)  Result Date: 01/15/2021 CLINICAL DATA:  80 year old male with a history aorta iliac disease, treated for left iliac stenosis 04/11/2015 EXAM: NONINVASIVE PHYSIOLOGIC VASCULAR STUDY OF BILATERAL LOWER EXTREMITIES TECHNIQUE: Evaluation of both lower extremities was performed at rest, including calculation of ankle-brachial indices, multiple segmental pressure evaluation, segmental Doppler and segmental pulse volume recording. COMPARISON:  None. FINDINGS: Right: Resting ankle brachial index:  0.84 Segmental blood pressure: Upper extremity pressures are symmetric. No significant drop between segments of the leg. Digital pressure measures 66 systolic Doppler: Segmental Doppler demonstrates triphasic waveform of the femoropopliteal segment and the posterior tibial artery. Decreased amplitude of the dorsalis pedis with monophasic waveform. Pulse volume recording: Segmental PVR maintained in the right lower extremity with augmentation maintained. Near flat line at the right great toe Left: Resting ankle brachial index: 0.88 Segmental blood pressure: Segmental pressures are symmetric in the upper extremity. Slight pressure decrease to the thigh with no significant drop between segments. Digital pressure measures 85 systolic. Doppler: Segmental Doppler demonstrates triphasic femoropopliteal segment and posterior tibial  artery. Monophasic dorsalis pedis. Pulse volume recording: Segmental PVR maintained in the left lower extremity with augmentation maintained. Blunted waveform at the left great toe Additional: IMPRESSION: Resting ABI the bilateral lower extremities in the mild range arterial occlusive disease. Segmental exam demonstrates patent femoropopliteal segment, with anterior tibial artery disease bilaterally. Signed, Yvone Neu. Reyne Dumas, RPVI [facility] Electronically Signed   By: Frederick Cisneros D.O.   On: 01/15/2021 15:22   IR Radiologist Eval & Mgmt  Result Date: 01/15/2021 Please refer to notes tab for details about interventional procedure. (Op Note)   Labs:  CBC: Recent Labs    11/24/20 1227  WBC 5.1  HGB 15.2  HCT 44.8  PLT 156    COAGS: No results for input(s): INR, APTT in the last 8760 hours.  BMP: Recent Labs    11/24/20 1227  NA 139  K 4.5  CL 107  CO2 24  GLUCOSE 109*  BUN 19  CALCIUM 9.9  CREATININE 1.41*  GFRNONAA 50*    LIVER FUNCTION TESTS: Recent Labs    11/24/20 1227  BILITOT 0.7  AST 33  ALT 29  ALKPHOS 55  PROT 7.9  ALBUMIN 4.5    TUMOR MARKERS: No results for input(s): AFPTM, CEA, CA199, CHROMGRNA in the last 8760 hours.  Assessment and Plan:  Assessment:  Mr Mang is an 80yo male with history of left CIA stenting for claudication, with no recurrence of symptoms after over 5 years.   Non-invasive lower extremity exam today is stable, with waveforms maintained in the fem-pop segments bilateral.   We discussed the fact that he is over 5 years out with no symptoms, and his question is whether there would be any future intervention.  I assured him that we would not plan on any intervention if he stays symptom free. Surveillance is the only plan for now.   Continued maximal medical therapy for reduction of risk factors is indicated as recommended by updated AHA guidelines1.  This includes  anti-platelet medication, tight blood glucose control to a HbA1c < 7, tight blood pressure control, maximum-dose HMG-CoA reductase inhibitor, and smoking cessation.    Annual flu vaccination is also recommended, with Class 1 recommendation1.   Plan: - 1 year follow up with office visit and repeat ABI/segmental.  - Continued maximal medical therapy for cardiovascular risk reduction, including anti-platelet therapy. -Annual flu vaccination is recommended in the setting of known  PAD, in the absence of contra-indications.    ___________________________________________________________________   1Monte Fantasia MD, et al. 2016 AHA/ACC Guideline on the Management of Patients With Lower Extremity Peripheral Artery Disease: Executive Summary: A Report of the American College of Cardiology/American Heart Association Task Force on Clinical Practice Guidelines. J Am Coll Cardiol. 2017 Mar 21;69(11):1465-1508. doi: 10.1016/j.jacc.2016.11.008.   2 - Norgren L, et al. TASC II Working Group. Inter-society consensus for the management of peripheral arterial disease. Int Nunzio Cobbs. 2007 Jun;26(2):81-157. Review. PubMed PMID: 89211941  3 - Hingorani A, et al. The management of diabetic foot: A clinical practice guideline by the Society for Vascular Surgery in collaboration with the American Podiatric Medical Association and the Society  for Vascular Medicine. J Vasc Surg. 2016 Feb;63(2 Suppl):3S-21S. doi: 10.1016/j.jvs.2015.10.003. PubMed PMID: 74081448.  4 - Luther Hearing, Saab FA, Elyse Jarvis, Danae Orleans, Deeann Cree, Driver VR, [place], Lookstein R, van den Tilman Neat, Jaff MR, Reinaldo Raddle, Henao S, AlMahameed A, Katzen B. Digital Subtraction Angiography Prior to an Amputation for Critical Limb Ischemia (CLI): An Expert Recommendation Statement From the CLI Global Society to Optimize Limb Salvage. J Endovasc Ther. 2020 Aug;27(4):540-546. doi: 10.1177/1526602820928590. Epub 2020 May 29. PMID: 18563149.        Electronically Signed: Gilmer Cisneros 01/15/2021, 3:25 PM   I spent a total of    15 Minutes in face to face in clinical consultation, greater than 50% of which was counseling/coordinating care for PAD, SP treatment of left CIA stenosis with stenting.

## 2021-12-08 DIAGNOSIS — H2513 Age-related nuclear cataract, bilateral: Secondary | ICD-10-CM | POA: Diagnosis not present

## 2021-12-08 DIAGNOSIS — H401133 Primary open-angle glaucoma, bilateral, severe stage: Secondary | ICD-10-CM | POA: Diagnosis not present

## 2021-12-11 ENCOUNTER — Other Ambulatory Visit: Payer: Self-pay | Admitting: Interventional Radiology

## 2021-12-11 DIAGNOSIS — I739 Peripheral vascular disease, unspecified: Secondary | ICD-10-CM

## 2021-12-29 ENCOUNTER — Ambulatory Visit
Admission: RE | Admit: 2021-12-29 | Discharge: 2021-12-29 | Disposition: A | Discharge status: Home / Self Care | Payer: Medicare Other | Source: Ambulatory Visit | Attending: Interventional Radiology | Admitting: Interventional Radiology

## 2021-12-29 ENCOUNTER — Encounter: Payer: Self-pay | Admitting: *Deleted

## 2021-12-29 DIAGNOSIS — I739 Peripheral vascular disease, unspecified: Secondary | ICD-10-CM

## 2021-12-29 DIAGNOSIS — I70213 Atherosclerosis of native arteries of extremities with intermittent claudication, bilateral legs: Secondary | ICD-10-CM | POA: Diagnosis not present

## 2021-12-29 DIAGNOSIS — Z9889 Other specified postprocedural states: Secondary | ICD-10-CM | POA: Diagnosis not present

## 2021-12-29 HISTORY — PX: IR RADIOLOGIST EVAL & MGMT: IMG5224

## 2021-12-29 NOTE — Progress Notes (Signed)
Chief Complaint: Patient was seen in consultation today for Peripheral Vascular Disease   Referring Physician(s): Dr. Johny Blamer   History of Present Illness: Frederick Cisneros. is a 81 y.o. male presenting [facility] as a scheduled follow-up for his peripheral arterial disease, SP treatment of Rutherford 3 class symptoms with prior bare-metal stenting of a flow-limiting lesion of the left iliac artery, 04/11/2015.     He is with [place] in the office today by himself, with no new complaints.     We last saw him 01/15/21.  Since then he tells me that he continues to go to the [facility] gym, at least 2 times per week, walking on the treadmill about 5 miles per session.  No symptoms.  He rides his "E-bike" to and from the gym, about 1 mile from his home, and also rides the bike in the park.    He is able to perform all of his ADL's comfortably, and has no claudication.    He continues maximal medical therapy.    He has had repeat non-invasive exam performed today.  Right ABI is 0.79 Left ABI is 0.86 The right segmental exam shows a waveform that is clearly multi-phasic at the dorsalis pedis, which is discordant from the blunted popliteal waveform.  The left segmental is not significantly changed.   Past Medical History:  Diagnosis Date   Arthritis    BPH (benign prostatic hyperplasia)    CAD (coronary artery disease)    Diverticulitis    Dyslipidemia    Esophageal reflux    Glaucoma    per pt   Hiatal hernia    Hypertension    LVH (left ventricular hypertrophy)    Sleep apnea    Type II or unspecified type diabetes mellitus without mention of complication, not stated as uncontrolled     Past Surgical History:  Procedure Laterality Date   CARDIAC CATHETERIZATION     CAROTID ENDARTERECTOMY     COLONOSCOPY     IR GENERIC HISTORICAL  04/29/2016   IR RADIOLOGIST EVAL & MGMT 04/29/2016 [name], DO [facility] INTERV RAD   IR RADIOLOGIST  EVAL & MGMT  12/22/2016   IR RADIOLOGIST EVAL & MGMT  12/23/2017   IR RADIOLOGIST EVAL & MGMT  12/14/2018   IR RADIOLOGIST EVAL & MGMT  12/28/2019   IR RADIOLOGIST EVAL & MGMT  01/15/2021   ROTATOR CUFF REPAIR     right   STOMACH SURGERY     TRANSURETHRAL RESECTION OF PROSTATE      Allergies: Patient has no known allergies.  Medications: Prior to Admission medications   Medication Sig Start Date End Date Taking? Authorizing Provider  amLODipine (NORVASC) 5 MG tablet Take 5 mg by mouth daily.     [provider]  aspirin 325 MG tablet Take 325 mg by mouth daily.    [provider]  clidinium-chlordiazePOXIDE (LIBRAX) 2.5-5 MG per capsule Take 1 capsule by mouth 2 (two) times daily as needed. 12/13/12   Mardella Layman, MD  colchicine 0.6 MG tablet Take 0.6 mg by mouth 2 (two) times daily as needed. 09/29/19   [provider]  dorzolamide-timolol (COSOPT) 22.3-6.8 MG/ML ophthalmic solution  08/22/19   [provider]  ketoconazole (NIZORAL) 2 % cream Apply 1 application topically daily as needed for irritation (itching). Reported on 11/05/2015 08/08/13   [provider]  lisinopril (PRINIVIL,ZESTRIL) 10 MG tablet Take 10 mg by mouth daily.  08/29/13  [provider]  metoprolol succinate (TOPROL-XL) 100 MG 24 hr tablet Take 100 mg by mouth daily. Take with or immediately following a meal.    [provider]  Multiple Vitamin (MULTIVITAMIN WITH MINERALS) TABS tablet Take 1 tablet by mouth daily.    [provider]  OVER THE COUNTER MEDICATION Place 1 drop into both eyes daily as needed (dry eyes/ irritation). Over the counter eye drop    [provider]  oxyCODONE-acetaminophen (PERCOCET/ROXICET) 5-325 MG tablet Take 1 tablet by mouth every 6 (six) hours as needed for severe pain. 11/24/20   Hyman Hopes, Margaux, PA-C  pantoprazole (PROTONIX) 40 MG tablet Take 40 mg by mouth 2 (two) times daily.    [provider]   rosuvastatin (CRESTOR) 40 MG tablet Take 40 mg by mouth daily. Reported on 11/05/2015    [provider]  sucralfate (CARAFATE) 1 GM/10ML suspension One tablespoon after meals and at bedtime Patient not taking: Reported on 12/14/2018 09/26/13   Mardella Layman, MD  Travoprost, BAK Free, (TRAVATAN) 0.004 % SOLN ophthalmic solution  08/22/19   [provider]     Family History  Problem Relation Age of Onset   Breast cancer Sister    Diabetes Sister        x 3   GI problems Father        type unknown   Colon cancer Neg Hx    Esophageal cancer Neg Hx    Stomach cancer Neg Hx    Rectal cancer Neg Hx     Social History   Socioeconomic History   Marital status: Married    Spouse name: Not on file   Number of children: 4   Years of education: Not on file   Highest education level: Not on file  Occupational History   Occupation: retired  Tobacco Use   Smoking status: Former    Types: Cigarettes    Quit date: 10/19/1988    Years since quitting: 33.2   Smokeless tobacco: Never  Vaping Use   Vaping Use: Never used  Substance and Sexual Activity   Alcohol use: Yes    Comment: occasional wine   Drug use: No   Sexual activity: Not on file  Other Topics Concern   Not on file  Social History Narrative   Not on file   Social Determinants of Health   Financial Resource Strain: Not on file  Food Insecurity: Not on file  Transportation Needs: Not on file  Physical Activity: Not on file  Stress: Not on file  Social Connections: Not on file       Review of Systems: A 12 point ROS discussed and pertinent positives are indicated in the HPI above.  All other systems are negative.  Review of Systems  Vital Signs: There were no vitals taken for this visit.  Physical Exam General: 81 yo male appearing stated age.  Well-developed, well-nourished.  No distress. HEENT: Atraumatic, normocephalic.  Conjugate gaze, extra-ocular motor intact. No scleral icterus or  scleral injection. No lesions on external ears, nose   Neck: Symmetric with no goiter enlargement.  Chest/Lungs:  Symmetric chest with inspiration/expiration.  No labored breathing.     Heart:  No JVD appreciated.  Abdomen:  Non-obese.   Genito-urinary: Deferred Neurologic: Alert & Oriented to person, place, and time.   Normal affect and insight.  Appropriate questions.  Moving all 4 extremities with gross sensory intact.  Pulse Exam:  multi-phasic doppler signal at the right &  left DP and PT Extremities: No wound.  Warm extremities bilateral.  No intertriginous wound.     Imaging: No results found.  Labs:  CBC: No results for input(s): WBC, HGB, HCT, PLT in the last 8760 hours.  COAGS: No results for input(s): INR, APTT in the last 8760 hours.  BMP: No results for input(s): NA, K, CL, CO2, GLUCOSE, BUN, CALCIUM, CREATININE, GFRNONAA, GFRAA in the last 8760 hours.  Invalid input(s): CMP  LIVER FUNCTION TESTS: No results for input(s): BILITOT, AST, ALT, ALKPHOS, PROT, ALBUMIN in the last 8760 hours.  TUMOR MARKERS: No results for input(s): AFPTM, CEA, CA199, CHROMGRNA in the last 8760 hours.  Assessment:  Mr Marilu FavreClarence is an 81yo male with history of left CIA stenting for claudication, with no recurrence of symptoms after ~7 years.    Non-invasive lower extremity exam today is relatively stable, with waveforms maintained bilateral.    We discussed the fact that he is over 5 years out with no symptoms, and his question is whether there would be any future intervention.  I assured him that we would not plan on any intervention if he stays symptom free. Surveillance is the only plan for now.   I have encouraged him to continue exercise as he does, and apparently very much enjoys, as well as continue maximal medical therapy.    Continued maximal medical therapy for reduction of risk factors is indicated as recommended by updated AHA guidelines1.  This includes anti-platelet  medication, tight blood glucose control to a HbA1c < 7, tight blood pressure control, maximum-dose HMG-CoA reductase inhibitor, and smoking cessation.    Annual flu vaccination is also recommended, with Class 1 recommendation1.    Plan: - 1 year follow up with office visit and repeat ABI/segmental.  - Continued maximal medical therapy for cardiovascular risk reduction, including anti-platelet therapy. -Annual flu vaccination is recommended in the setting of known PAD, in the absence of contra-indications.      ___________________________________________________________________     1Monte Fantasia- Gerhard-Herman MD, et al. 2016 AHA/ACC Guideline on the Management of Patients With Lower Extremity Peripheral Artery Disease: Executive Summary: A Report of the American College of Cardiology/American Heart Association Task Force on Clinical Practice Guidelines. J Am Coll Cardiol. 2017 Mar 21;69(11):1465-1508. doi: 10.1016/j.jacc.2016.11.008.    2 - Norgren L, et al. TASC II Working Group. Inter-society consensus for the management of peripheral arterial disease. Int Nunzio CobbsAngiol. 2007 Jun;26(2):81-157. Review. PubMed PMID: 6213086517489079   3 - Hingorani A, et al. The management of diabetic foot: A clinical practice guideline by the Society for Vascular Surgery in collaboration with the American Podiatric Medical Association and the Society  for Vascular Medicine. J Vasc Surg. 2016 Feb;63(2 Suppl):3S-21S. doi: 10.1016/j.jvs.2015.10.003. PubMed PMID: 7846962926804367.   4 - Luther HearingMustapha JA, Saab FA, Elyse JarvisMartinsen BJ, Danae OrleansPena CS, Deeann CreeZeller T, Driver VR, [place] RF, Lookstein R, van den Tilman NeatBerg JC, Jaff MR, Reinaldo RaddleMichael P, Henao S, AlMahameed A, Katzen B. Digital Subtraction Angiography Prior to an Amputation for Critical Limb Ischemia (CLI): An Expert Recommendation Statement From the CLI Global Society to Optimize Limb Salvage. J Endovasc Ther. 2020 Aug;27(4):540-546. doi: 10.1177/1526602820928590. Epub 2020 May 29. PMID: 5284132432469294.     Electronically  Signed: Gilmer MorJaime Aveer Bartow 12/29/2021, 12:19 PM   I spent a total of    15 Minutes in face to face in clinical consultation, greater than 50% of which was counseling/coordinating care for PAD, SP treatment of left iliac TASC A disease

## 2022-01-22 DIAGNOSIS — E119 Type 2 diabetes mellitus without complications: Secondary | ICD-10-CM | POA: Diagnosis not present

## 2022-01-22 DIAGNOSIS — I700 Atherosclerosis of aorta: Secondary | ICD-10-CM | POA: Diagnosis not present

## 2022-01-22 DIAGNOSIS — H9312 Tinnitus, left ear: Secondary | ICD-10-CM | POA: Diagnosis not present

## 2022-01-22 DIAGNOSIS — E7800 Pure hypercholesterolemia, unspecified: Secondary | ICD-10-CM | POA: Diagnosis not present

## 2022-01-22 DIAGNOSIS — I739 Peripheral vascular disease, unspecified: Secondary | ICD-10-CM | POA: Diagnosis not present

## 2022-01-22 DIAGNOSIS — K219 Gastro-esophageal reflux disease without esophagitis: Secondary | ICD-10-CM | POA: Diagnosis not present

## 2022-01-22 DIAGNOSIS — H409 Unspecified glaucoma: Secondary | ICD-10-CM | POA: Diagnosis not present

## 2022-01-22 DIAGNOSIS — G4733 Obstructive sleep apnea (adult) (pediatric): Secondary | ICD-10-CM | POA: Diagnosis not present

## 2022-01-22 DIAGNOSIS — N1831 Chronic kidney disease, stage 3a: Secondary | ICD-10-CM | POA: Diagnosis not present

## 2022-01-22 DIAGNOSIS — I10 Essential (primary) hypertension: Secondary | ICD-10-CM | POA: Diagnosis not present

## 2022-03-09 DIAGNOSIS — R1032 Left lower quadrant pain: Secondary | ICD-10-CM | POA: Diagnosis not present

## 2022-03-09 DIAGNOSIS — N1832 Chronic kidney disease, stage 3b: Secondary | ICD-10-CM | POA: Diagnosis not present

## 2022-03-09 DIAGNOSIS — K5900 Constipation, unspecified: Secondary | ICD-10-CM | POA: Diagnosis not present

## 2022-03-23 ENCOUNTER — Inpatient Hospital Stay (HOSPITAL_COMMUNITY)
Admission: EM | Admit: 2022-03-23 | Discharge: 2022-03-28 | DRG: 872 | Disposition: A | Discharge status: Home / Self Care | Payer: Medicare Other | Attending: Internal Medicine | Admitting: Internal Medicine

## 2022-03-23 ENCOUNTER — Encounter (HOSPITAL_COMMUNITY): Payer: Self-pay | Admitting: Emergency Medicine

## 2022-03-23 ENCOUNTER — Emergency Department (HOSPITAL_COMMUNITY): Payer: Medicare Other

## 2022-03-23 ENCOUNTER — Other Ambulatory Visit: Payer: Self-pay

## 2022-03-23 DIAGNOSIS — Z7982 Long term (current) use of aspirin: Secondary | ICD-10-CM | POA: Diagnosis not present

## 2022-03-23 DIAGNOSIS — E7800 Pure hypercholesterolemia, unspecified: Secondary | ICD-10-CM | POA: Diagnosis present

## 2022-03-23 DIAGNOSIS — N179 Acute kidney failure, unspecified: Secondary | ICD-10-CM | POA: Diagnosis present

## 2022-03-23 DIAGNOSIS — R6520 Severe sepsis without septic shock: Secondary | ICD-10-CM | POA: Diagnosis present

## 2022-03-23 DIAGNOSIS — R109 Unspecified abdominal pain: Secondary | ICD-10-CM | POA: Diagnosis not present

## 2022-03-23 DIAGNOSIS — Z87891 Personal history of nicotine dependence: Secondary | ICD-10-CM | POA: Diagnosis not present

## 2022-03-23 DIAGNOSIS — N400 Enlarged prostate without lower urinary tract symptoms: Secondary | ICD-10-CM | POA: Diagnosis present

## 2022-03-23 DIAGNOSIS — M1990 Unspecified osteoarthritis, unspecified site: Secondary | ICD-10-CM | POA: Diagnosis not present

## 2022-03-23 DIAGNOSIS — R197 Diarrhea, unspecified: Secondary | ICD-10-CM | POA: Diagnosis not present

## 2022-03-23 DIAGNOSIS — R5383 Other fatigue: Secondary | ICD-10-CM | POA: Diagnosis not present

## 2022-03-23 DIAGNOSIS — E876 Hypokalemia: Secondary | ICD-10-CM | POA: Diagnosis not present

## 2022-03-23 DIAGNOSIS — A0472 Enterocolitis due to Clostridium difficile, not specified as recurrent: Secondary | ICD-10-CM | POA: Diagnosis present

## 2022-03-23 DIAGNOSIS — H409 Unspecified glaucoma: Secondary | ICD-10-CM | POA: Diagnosis present

## 2022-03-23 DIAGNOSIS — Z833 Family history of diabetes mellitus: Secondary | ICD-10-CM | POA: Diagnosis not present

## 2022-03-23 DIAGNOSIS — Z79899 Other long term (current) drug therapy: Secondary | ICD-10-CM | POA: Diagnosis not present

## 2022-03-23 DIAGNOSIS — E860 Dehydration: Secondary | ICD-10-CM | POA: Diagnosis present

## 2022-03-23 DIAGNOSIS — R55 Syncope and collapse: Secondary | ICD-10-CM | POA: Diagnosis not present

## 2022-03-23 DIAGNOSIS — E119 Type 2 diabetes mellitus without complications: Secondary | ICD-10-CM | POA: Diagnosis present

## 2022-03-23 DIAGNOSIS — A414 Sepsis due to anaerobes: Secondary | ICD-10-CM | POA: Diagnosis not present

## 2022-03-23 DIAGNOSIS — A419 Sepsis, unspecified organism: Secondary | ICD-10-CM

## 2022-03-23 DIAGNOSIS — G4730 Sleep apnea, unspecified: Secondary | ICD-10-CM | POA: Diagnosis present

## 2022-03-23 DIAGNOSIS — K529 Noninfective gastroenteritis and colitis, unspecified: Secondary | ICD-10-CM | POA: Diagnosis not present

## 2022-03-23 DIAGNOSIS — K219 Gastro-esophageal reflux disease without esophagitis: Secondary | ICD-10-CM | POA: Diagnosis not present

## 2022-03-23 DIAGNOSIS — I10 Essential (primary) hypertension: Secondary | ICD-10-CM | POA: Diagnosis not present

## 2022-03-23 DIAGNOSIS — R404 Transient alteration of awareness: Secondary | ICD-10-CM | POA: Diagnosis not present

## 2022-03-23 DIAGNOSIS — Z743 Need for continuous supervision: Secondary | ICD-10-CM | POA: Diagnosis not present

## 2022-03-23 DIAGNOSIS — I2510 Atherosclerotic heart disease of native coronary artery without angina pectoris: Secondary | ICD-10-CM | POA: Diagnosis not present

## 2022-03-23 DIAGNOSIS — I700 Atherosclerosis of aorta: Secondary | ICD-10-CM | POA: Diagnosis not present

## 2022-03-23 DIAGNOSIS — R6889 Other general symptoms and signs: Secondary | ICD-10-CM | POA: Diagnosis not present

## 2022-03-23 LAB — CBC WITH DIFFERENTIAL/PLATELET
Abs Immature Granulocytes: 0.05 10*3/uL (ref 0.00–0.07)
Basophils Absolute: 0 10*3/uL (ref 0.0–0.1)
Basophils Relative: 0 %
Eosinophils Absolute: 0 10*3/uL (ref 0.0–0.5)
Eosinophils Relative: 0 %
HCT: 47.1 % (ref 39.0–52.0)
Hemoglobin: 15.7 g/dL (ref 13.0–17.0)
Immature Granulocytes: 0 %
Lymphocytes Relative: 7 %
Lymphs Abs: 0.8 10*3/uL (ref 0.7–4.0)
MCH: 30.7 pg (ref 26.0–34.0)
MCHC: 33.3 g/dL (ref 30.0–36.0)
MCV: 92.2 fL (ref 80.0–100.0)
Monocytes Absolute: 1 10*3/uL (ref 0.1–1.0)
Monocytes Relative: 9 %
Neutro Abs: 9.6 10*3/uL — ABNORMAL HIGH (ref 1.7–7.7)
Neutrophils Relative %: 84 %
Platelets: 190 10*3/uL (ref 150–400)
RBC: 5.11 MIL/uL (ref 4.22–5.81)
RDW: 14.1 % (ref 11.5–15.5)
WBC: 11.4 10*3/uL — ABNORMAL HIGH (ref 4.0–10.5)
nRBC: 0 % (ref 0.0–0.2)

## 2022-03-23 LAB — MAGNESIUM: Magnesium: 2.3 mg/dL (ref 1.7–2.4)

## 2022-03-23 LAB — COMPREHENSIVE METABOLIC PANEL
ALT: 28 U/L (ref 0–44)
AST: 29 U/L (ref 15–41)
Albumin: 4.4 g/dL (ref 3.5–5.0)
Alkaline Phosphatase: 59 U/L (ref 38–126)
Anion gap: 8 (ref 5–15)
BUN: 20 mg/dL (ref 8–23)
CO2: 24 mmol/L (ref 22–32)
Calcium: 9.7 mg/dL (ref 8.9–10.3)
Chloride: 108 mmol/L (ref 98–111)
Creatinine, Ser: 1.63 mg/dL — ABNORMAL HIGH (ref 0.61–1.24)
GFR, Estimated: 42 mL/min — ABNORMAL LOW (ref >60)
Glucose, Bld: 149 mg/dL — ABNORMAL HIGH (ref 70–99)
Potassium: 4.7 mmol/L (ref 3.5–5.1)
Sodium: 140 mmol/L (ref 135–145)
Total Bilirubin: 0.8 mg/dL (ref 0.3–1.2)
Total Protein: 8.3 g/dL — ABNORMAL HIGH (ref 6.5–8.1)

## 2022-03-23 LAB — LACTIC ACID, PLASMA: Lactic Acid, Venous: 1.8 mmol/L (ref 0.5–1.9)

## 2022-03-23 MED ORDER — SODIUM CHLORIDE 0.9 % IV BOLUS
500.0000 mL | Freq: Once | INTRAVENOUS | Status: AC
  Administered 2022-03-23: 500 mL via INTRAVENOUS

## 2022-03-23 MED ORDER — ACETAMINOPHEN 325 MG PO TABS
650.0000 mg | ORAL_TABLET | Freq: Four times a day (QID) | ORAL | Status: DC | PRN
  Administered 2022-03-27: 650 mg via ORAL
  Filled 2022-03-23 (×2): qty 2

## 2022-03-23 MED ORDER — METRONIDAZOLE 500 MG/100ML IV SOLN
500.0000 mg | Freq: Two times a day (BID) | INTRAVENOUS | Status: DC
  Filled 2022-03-23: qty 100

## 2022-03-23 MED ORDER — ONDANSETRON HCL 4 MG/2ML IJ SOLN
4.0000 mg | Freq: Four times a day (QID) | INTRAMUSCULAR | Status: DC | PRN
  Administered 2022-03-24: 4 mg via INTRAVENOUS
  Filled 2022-03-23: qty 2

## 2022-03-23 MED ORDER — FENTANYL CITRATE PF 50 MCG/ML IJ SOSY
25.0000 ug | PREFILLED_SYRINGE | INTRAMUSCULAR | Status: DC | PRN
  Administered 2022-03-24: 25 ug via INTRAVENOUS
  Filled 2022-03-23: qty 1

## 2022-03-23 MED ORDER — IOHEXOL 300 MG/ML  SOLN
80.0000 mL | Freq: Once | INTRAMUSCULAR | Status: AC | PRN
  Administered 2022-03-23: 80 mL via INTRAVENOUS

## 2022-03-23 MED ORDER — CIPROFLOXACIN IN D5W 400 MG/200ML IV SOLN
400.0000 mg | Freq: Two times a day (BID) | INTRAVENOUS | Status: DC
  Filled 2022-03-23: qty 200

## 2022-03-23 MED ORDER — SODIUM CHLORIDE 0.9 % IV SOLN: INTRAVENOUS | Status: DC

## 2022-03-23 MED ORDER — NALOXONE HCL 0.4 MG/ML IJ SOLN: 0.4000 mg | INTRAMUSCULAR | Status: DC | PRN

## 2022-03-23 MED ORDER — ACETAMINOPHEN 650 MG RE SUPP: 650.0000 mg | Freq: Four times a day (QID) | RECTAL | Status: DC | PRN

## 2022-03-23 MED ORDER — PIPERACILLIN-TAZOBACTAM 3.375 G IVPB
3.3750 g | Freq: Three times a day (TID) | INTRAVENOUS | Status: DC
  Administered 2022-03-23 – 2022-03-25 (×5): 3.375 g via INTRAVENOUS
  Filled 2022-03-23 (×6): qty 50

## 2022-03-23 NOTE — Progress Notes (Signed)
Pharmacy Antibiotic Note  Frederick Cisneros. is a 81 y.o. male admitted on 03/23/2022 with diarrhea. Patient with history of diverticulitis, for which he was taking ciprofloxacin and metronidazole outpatient. Pharmacy has been consulted for Zosyn dosing.  Plan: Zosyn 3.375 g IV q8h extended infusion  Pharmacy to sign off consult but will continue to follow renal function, cultures and clinical progress for dosage adjustments or de-escalation as indicated.     Temp (24hrs), Avg:98.8 F (37.1 C), Min:98.8 F (37.1 C), Max:98.8 F (37.1 C)  Recent Labs  Lab 03/23/22 1741  WBC 11.4*  CREATININE 1.63*  LATICACIDVEN 1.8    CrCl cannot be calculated (Unknown ideal weight.).    No Known Allergies  Antimicrobials this admission: Zosyn 6/5 >>  Dose adjustments this admission: N/A  Microbiology results: 6/5 UCx: ordered    Thank you for allowing pharmacy to be a part of this patient's care.  Pricilla Riffle, PharmD, BCPS Clinical Pharmacist 03/23/2022 9:04 PM

## 2022-03-23 NOTE — ED Triage Notes (Signed)
Pt arrived via EMS from [facility]. Pt has had diarrhea for the past week, which has worsened today. Pt has hx of diverticulitis. Pt's diarrhea began while he was taking antibiotics for his diverticulitis. Pt denies pain but states he has discomfort in his abdomen

## 2022-03-23 NOTE — ED Provider Notes (Signed)
[facility]   CSN: [id number] Arrival date & time: 03/23/22  1612     History  Chief Complaint  Patient presents with   Diarrhea    [name] is a 81 y.o. male.  HPI He is here for evaluation of profuse diarrhea with chills that started yesterday.  He called EMS to bring him here.  He lives alone.  He states he completed a course of antibiotics to treat diverticulitis, 1 week ago.  He denies nausea, vomiting, shortness of breath, cough, weakness or dizziness.  He lives alone.    Home Medications Prior to Admission medications   Medication Sig Start Date End Date Taking? Authorizing Provider  amLODipine (NORVASC) 5 MG tablet Take 5 mg by mouth daily.     [provider]  aspirin 325 MG tablet Take 325 mg by mouth daily.    [provider]  clidinium-chlordiazePOXIDE (LIBRAX) 2.5-5 MG per capsule Take 1 capsule by mouth 2 (two) times daily as needed. 12/13/12   Mardella LaymanPatterson, David R, MD  colchicine 0.6 MG tablet Take 0.6 mg by mouth 2 (two) times daily as needed. 09/29/19   [provider]  dorzolamide-timolol (COSOPT) 22.3-6.8 MG/ML ophthalmic solution  08/22/19   [provider]  ketoconazole (NIZORAL) 2 % cream Apply 1 application topically daily as needed for irritation (itching). Reported on 11/05/2015 08/08/13   [provider]  lisinopril (PRINIVIL,ZESTRIL) 10 MG tablet Take 10 mg by mouth daily.  08/29/13   [provider]  metoprolol succinate (TOPROL-XL) 100 MG 24 hr tablet Take 100 mg by mouth daily. Take with or immediately following a meal.    [provider]  Multiple Vitamin (MULTIVITAMIN WITH MINERALS) TABS tablet Take 1 tablet by mouth daily.    [provider]  OVER THE COUNTER MEDICATION Place 1 drop into both eyes daily as needed (dry eyes/ irritation). Over the counter eye drop    [provider]  oxyCODONE-acetaminophen  (PERCOCET/ROXICET) 5-325 MG tablet Take 1 tablet by mouth every 6 (six) hours as needed for severe pain. 11/24/20   Hyman HopesVenter, Margaux, PA-C  pantoprazole (PROTONIX) 40 MG tablet Take 40 mg by mouth 2 (two) times daily.    [provider]  rosuvastatin (CRESTOR) 40 MG tablet Take 40 mg by mouth daily. Reported on 11/05/2015    [provider]  sucralfate (CARAFATE) 1 GM/10ML suspension One tablespoon after meals and at bedtime Patient not taking: Reported on 12/14/2018 09/26/13   Mardella LaymanPatterson, David R, MD  Travoprost, BAK Free, (TRAVATAN) 0.004 % SOLN ophthalmic solution  08/22/19   [provider]      Allergies    Patient has no known allergies.    Review of Systems   Review of Systems  Physical Exam Updated Vital Signs BP (!) 145/120   Pulse 95   Temp 98.8 F (37.1 C) (Oral)   Resp 18   SpO2 94%  Physical Exam Vitals and nursing note reviewed.  Constitutional:      Appearance: He is well-developed. He is not ill-appearing or diaphoretic.  HENT:     Head: Normocephalic and atraumatic.     Right Ear: External ear normal.     Left Ear: External ear normal.     Mouth/Throat:     Mouth: Mucous membranes are moist.     Pharynx: Posterior oropharyngeal erythema present.  Eyes:     Conjunctiva/sclera: Conjunctivae normal.     Pupils: Pupils are equal, round, and  reactive to light.  Neck:     Trachea: Phonation normal.  Cardiovascular:     Rate and Rhythm: Normal rate and regular rhythm.     Heart sounds: Normal heart sounds.  Pulmonary:     Effort: Pulmonary effort is normal. No respiratory distress.     Breath sounds: Normal breath sounds. No stridor.  Abdominal:     General: There is distension.     Palpations: Abdomen is soft. There is no mass.     Tenderness: There is no abdominal tenderness.     Hernia: No hernia is present.  Musculoskeletal:        General: Normal range of motion.     Cervical back: Normal range of motion and neck supple.  Skin:     General: Skin is warm and dry.  Neurological:     Mental Status: He is alert and oriented to person, place, and time.     Cranial Nerves: No cranial nerve deficit.     Sensory: No sensory deficit.     Motor: No abnormal muscle tone.     Coordination: Coordination normal.  Psychiatric:        Mood and Affect: Mood normal.        Behavior: Behavior normal.        Thought Content: Thought content normal.        Judgment: Judgment normal.    ED Results / Procedures / Treatments   Labs (all labs ordered are listed, but only abnormal results are displayed) Labs Reviewed  COMPREHENSIVE METABOLIC PANEL - Abnormal; Notable for the following components:      Result Value   Glucose, Bld 149 (*)    Creatinine, Ser 1.63 (*)    Total Protein 8.3 (*)    GFR, Estimated 42 (*)    All other components within normal limits  CBC WITH DIFFERENTIAL/PLATELET - Abnormal; Notable for the following components:   WBC 11.4 (*)    Neutro Abs 9.6 (*)    All other components within normal limits  LACTIC ACID, PLASMA    EKG None  Radiology CT Abdomen Pelvis W Contrast  Result Date: 03/23/2022 CLINICAL DATA:  Acute abdominal pain. Diarrhea. Recent antibiotics for diverticulitis. EXAM: CT ABDOMEN AND PELVIS WITH CONTRAST TECHNIQUE: Multidetector CT imaging of the abdomen and pelvis was performed using the standard protocol following bolus administration of intravenous contrast. RADIATION DOSE REDUCTION: This exam was performed according to the departmental dose-optimization program which includes automated exposure control, adjustment of the mA and/or kV according to patient size and/or use of iterative reconstruction technique. CONTRAST:  35mL OMNIPAQUE IOHEXOL 300 MG/ML  SOLN COMPARISON:  Noncontrast CT 11/24/2020 FINDINGS: Lower chest: No focal airspace disease or pleural effusion. Hepatobiliary: No focal liver abnormality is seen. No gallstones, gallbladder wall thickening, or biliary dilatation. Pancreas: No  ductal dilatation or inflammation. Spleen: Normal in size without focal abnormality. Adrenals/Urinary Tract: Normal adrenal glands. There is minimal left perinephric edema. Suggestion of mild proximal left ureteral enhancement in soft tissue thickening. Punctate nonobstructing stone in the mid left kidney. No ureteral calculi. Right kidney is inferiorly located, but without acute findings. The urinary bladder is minimally distended, the right aspect of the bladder extends into a right inguinal hernia. No bladder wall thickening. Stomach/Bowel: There is colonic wall thickening extending from the mid descending through the sigmoid colon with mild pericolonic edema. There are multiple diverticula in the region of colonic wall thickening, but no definite discretely inflamed diverticulum. The appendix is normal. There  is no small bowel obstruction or small bowel inflammation. Unremarkable appearance of the stomach. Vascular/Lymphatic: Advanced aortic and branch atherosclerosis. Dense calcification of the iliac arteries. No acute vascular findings. Patent portal vein. No abdominopelvic adenopathy. Reproductive: Enlarged prostate gland. Other: Right inguinal hernia contains fat and right aspect of the bladder dome. There is a small fat containing left inguinal hernia. No abdominopelvic ascites or free air. No bowel perforation or focal fluid collection. Musculoskeletal: There are no acute or suspicious osseous abnormalities. IMPRESSION: 1. Colitis extending from the mid descending through the sigmoid colon, likely infectious or inflammatory. There are multiple diverticula in the region of colonic wall thickening, but no definite discretely inflamed diverticulum. 2. There is mild left perinephric edema with slight enhancement and thickening of the left renal pelvis, query pyelonephritis. 3. Punctate nonobstructing stone in the mid left kidney. 4. Right inguinal hernia contains fat and right aspect of the bladder dome. Small  fat containing left inguinal hernia. 5. Enlarged prostate gland. Aortic Atherosclerosis (ICD10-I70.0). Electronically Signed   By: Narda Rutherford M.D.   On: 03/23/2022 19:11    Procedures Procedures    Medications Ordered in ED Medications  sodium chloride 0.9 % bolus 500 mL (0 mLs Intravenous Stopped 03/23/22 1941)  iohexol (OMNIPAQUE) 300 MG/ML solution 80 mL (80 mLs Intravenous Contrast Given 03/23/22 1849)    ED Course/ Medical Decision Making/ A&P                           Medical Decision Making Patient presenting with recurrent symptoms of diverticulitis.  Treated empirically for diverticulitis, about 2 weeks ago, completed the course of antibiotics 1 week ago.  Presenting primarily with diarrhea and chills.  Problems Addressed: Colitis: acute illness or injury with systemic symptoms that poses a threat to life or bodily functions    Details: Worsening symptoms after initial treatment for cholecystitis, 2 weeks ago.  No associated abscess.  Amount and/or Complexity of Data Reviewed Independent Historian:     Details: He is a cogent historian Labs: ordered.    Details: CBC, lactate, metabolic panel-normal except white count high glucose high, creatinine high, protein high, GFR low Radiology: ordered.    Details: CT abdomen and pelvis-descending and sigmoid colon colitis with diverticulis, left perinephric edema.  Uncomplicated right inguinal hernia is present. Discussion of management or test interpretation with external provider(s): Consultation hospitalist arrange for admission.  Risk Prescription drug management. Decision regarding hospitalization. Risk Details: Patient with persistent abdominal pain for 2 weeks now with diarrhea worsening despite outpatient treatment.  Has generalized colitis descending and sigmoid colon without specifically inflamed diverticuli.  Abnormal appearing left kidney with concern for possible pyelonephritis.  Patient does not currently have  urinary tract symptoms.  He will require hospitalization for bowel rest, parenteral antibiotics and possibly consultation with GI.  Risk factors include history of diabetes, hypertension and coronary disease.  Risk include diabetes, hypertension and coronary disease.  History of left ventricular hypertrophy, and mild peripheral arterial disease.  Critical Care Total time providing critical care: 35 minutes          Final Clinical Impression(s) / ED Diagnoses Final diagnoses:  Colitis    Rx / DC Orders ED Discharge Orders     None         Mancel Bale, MD 03/23/22 2345

## 2022-03-24 ENCOUNTER — Encounter (HOSPITAL_COMMUNITY): Payer: Self-pay | Admitting: Internal Medicine

## 2022-03-24 DIAGNOSIS — K529 Noninfective gastroenteritis and colitis, unspecified: Secondary | ICD-10-CM | POA: Diagnosis not present

## 2022-03-24 DIAGNOSIS — E860 Dehydration: Secondary | ICD-10-CM | POA: Diagnosis present

## 2022-03-24 DIAGNOSIS — A419 Sepsis, unspecified organism: Secondary | ICD-10-CM | POA: Diagnosis present

## 2022-03-24 DIAGNOSIS — N179 Acute kidney failure, unspecified: Secondary | ICD-10-CM | POA: Diagnosis present

## 2022-03-24 LAB — URINALYSIS, ROUTINE W REFLEX MICROSCOPIC
Bacteria, UA: NONE SEEN
Bilirubin Urine: NEGATIVE
Glucose, UA: NEGATIVE mg/dL
Ketones, ur: NEGATIVE mg/dL
Leukocytes,Ua: NEGATIVE
Nitrite: NEGATIVE
Protein, ur: 30 mg/dL — AB
Specific Gravity, Urine: 1.042 — ABNORMAL HIGH (ref 1.005–1.030)
pH: 5 (ref 5.0–8.0)

## 2022-03-24 LAB — CBC WITH DIFFERENTIAL/PLATELET
Abs Immature Granulocytes: 0.04 10*3/uL (ref 0.00–0.07)
Basophils Absolute: 0 10*3/uL (ref 0.0–0.1)
Basophils Relative: 0 %
Eosinophils Absolute: 0 10*3/uL (ref 0.0–0.5)
Eosinophils Relative: 0 %
HCT: 37.8 % — ABNORMAL LOW (ref 39.0–52.0)
Hemoglobin: 12.5 g/dL — ABNORMAL LOW (ref 13.0–17.0)
Immature Granulocytes: 0 %
Lymphocytes Relative: 14 %
Lymphs Abs: 1.3 10*3/uL (ref 0.7–4.0)
MCH: 30.6 pg (ref 26.0–34.0)
MCHC: 33.1 g/dL (ref 30.0–36.0)
MCV: 92.6 fL (ref 80.0–100.0)
Monocytes Absolute: 1.2 10*3/uL — ABNORMAL HIGH (ref 0.1–1.0)
Monocytes Relative: 13 %
Neutro Abs: 6.7 10*3/uL (ref 1.7–7.7)
Neutrophils Relative %: 73 %
Platelets: 161 10*3/uL (ref 150–400)
RBC: 4.08 MIL/uL — ABNORMAL LOW (ref 4.22–5.81)
RDW: 14.3 % (ref 11.5–15.5)
WBC: 9.2 10*3/uL (ref 4.0–10.5)
nRBC: 0 % (ref 0.0–0.2)

## 2022-03-24 LAB — COMPREHENSIVE METABOLIC PANEL
ALT: 19 U/L (ref 0–44)
AST: 20 U/L (ref 15–41)
Albumin: 3.3 g/dL — ABNORMAL LOW (ref 3.5–5.0)
Alkaline Phosphatase: 42 U/L (ref 38–126)
Anion gap: 8 (ref 5–15)
BUN: 22 mg/dL (ref 8–23)
CO2: 21 mmol/L — ABNORMAL LOW (ref 22–32)
Calcium: 8.4 mg/dL — ABNORMAL LOW (ref 8.9–10.3)
Chloride: 109 mmol/L (ref 98–111)
Creatinine, Ser: 1.93 mg/dL — ABNORMAL HIGH (ref 0.61–1.24)
GFR, Estimated: 34 mL/min — ABNORMAL LOW (ref >60)
Glucose, Bld: 145 mg/dL — ABNORMAL HIGH (ref 70–99)
Potassium: 4 mmol/L (ref 3.5–5.1)
Sodium: 138 mmol/L (ref 135–145)
Total Bilirubin: 0.7 mg/dL (ref 0.3–1.2)
Total Protein: 6.2 g/dL — ABNORMAL LOW (ref 6.5–8.1)

## 2022-03-24 LAB — SODIUM, URINE, RANDOM: Sodium, Ur: 21 mmol/L

## 2022-03-24 LAB — C DIFFICILE QUICK SCREEN W PCR REFLEX
C Diff antigen: POSITIVE — AB
C Diff toxin: NEGATIVE

## 2022-03-24 LAB — MAGNESIUM: Magnesium: 1.7 mg/dL (ref 1.7–2.4)

## 2022-03-24 LAB — CREATININE, URINE, RANDOM: Creatinine, Urine: 204.75 mg/dL

## 2022-03-24 LAB — CLOSTRIDIUM DIFFICILE BY PCR, REFLEXED: Toxigenic C. Difficile by PCR: POSITIVE — AB

## 2022-03-24 MED ORDER — METOPROLOL SUCCINATE ER 50 MG PO TB24
100.0000 mg | ORAL_TABLET | Freq: Every day | ORAL | Status: DC
Start: 2022-03-24 — End: 2022-03-24

## 2022-03-24 MED ORDER — MELATONIN 3 MG PO TABS
3.0000 mg | ORAL_TABLET | Freq: Once | ORAL | Status: AC
  Administered 2022-03-24: 3 mg via ORAL
  Filled 2022-03-24: qty 1

## 2022-03-24 MED ORDER — CHOLESTYRAMINE 4 G PO PACK
4.0000 g | PACK | Freq: Four times a day (QID) | ORAL | Status: DC
  Administered 2022-03-24 – 2022-03-25 (×4): 4 g via ORAL
  Filled 2022-03-24 (×5): qty 1

## 2022-03-24 MED ORDER — DORZOLAMIDE HCL-TIMOLOL MAL 2-0.5 % OP SOLN
1.0000 [drp] | Freq: Two times a day (BID) | OPHTHALMIC | Status: DC
Start: 2022-03-24 — End: 2022-03-28
  Administered 2022-03-24 – 2022-03-28 (×8): 1 [drp] via OPHTHALMIC
  Filled 2022-03-24: qty 10

## 2022-03-24 MED ORDER — LATANOPROST 0.005 % OP SOLN
1.0000 [drp] | Freq: Every day | OPHTHALMIC | Status: DC
  Administered 2022-03-24 – 2022-03-27 (×4): 1 [drp] via OPHTHALMIC
  Filled 2022-03-24: qty 2.5

## 2022-03-24 MED ORDER — METOPROLOL SUCCINATE ER 25 MG PO TB24
25.0000 mg | ORAL_TABLET | Freq: Every day | ORAL | Status: DC
Start: 2022-03-24 — End: 2022-03-27
  Administered 2022-03-24 – 2022-03-27 (×4): 25 mg via ORAL
  Filled 2022-03-24 (×4): qty 1

## 2022-03-24 NOTE — Progress Notes (Signed)
Frederick Cisneros.  ACZ:[id number] DOB: 02/16/1941 DOA: 03/23/2022 PCP: Shirline Frees, MD    Brief Narrative:  81 year old with a history of HTN and HLD who presented to the ER with abdominal pain which had progressively worsened over a 2-week timeframe.  He reported the pain was worsened by attempts to eat or drink and was associated with subjective fevers intermittently as well as generalized myalgia and loose stools.  He was diagnosed with colitis as an outpatient and began a course of Cipro and Flagyl which was completed 03/16/2022 with no significant improvement in his symptoms.  CT abdomen/pelvis in the ER noted colitis extending from the mid descending to sigmoid colon without evidence of obstruction perforation or abscess.  Consultants:  None  Goals of Care:  Code Status: Full Code   DVT prophylaxis: SCDs  Interim Hx: Vital signs stable.  Creatinine worsening.  WBC declining.  Watery diarrhea persists.  Abdominal cramping persist.  No chest pain or shortness of breath.  Very poor appetite.  Assessment & Plan:  Infectious colitis with sepsis Met sepsis criteria at presentation due to leukocytosis, tachypnea, and suspected source of bacterial infection -continue empiric antibiotic for now - f/u C diff testing - utilize [place] until clear if imodium safe to use - IVF support - sepsis physiology improved  Acute kidney insult Creatinine 1.63 at presentation with baseline appearing to be approximately 1.4 dating to February 2022 - cont to hydrate and follow  HTN Holding home blood pressure medications for now w/ BP well controlled   HLD Holding home rosuvastatin until oral intake much improved    Family Communication: No family present at time of exam Disposition: From home -anticipate eventual return home once clinically improved   Objective: Blood pressure (!) 128/52, pulse 76, temperature 98.8 F (37.1 C), temperature source Oral, resp. rate 16, SpO2 93  %.  Intake/Output Summary (Last 24 hours) at 03/24/2022 0819 Last data filed at 03/24/2022 0233 Gross per 24 hour  Intake 1050 ml  Output --  Net 1050 ml   There were no vitals filed for this visit.  Examination: General: No acute respiratory distress Lungs: Clear to auscultation bilaterally without wheezes or crackles Cardiovascular: Regular rate and rhythm without murmur gallop or rub normal S1 and S2 Abdomen: Diffusely tender without point tenderness, soft, bowel sounds positive, no mass, no rebound Extremities: No significant cyanosis, clubbing, or edema bilateral lower extremities  CBC: Recent Labs  Lab 03/23/22 1741 03/24/22 0436  WBC 11.4* 9.2  NEUTROABS 9.6* 6.7  HGB 15.7 12.5*  HCT 47.1 37.8*  MCV 92.2 92.6  PLT 190 109   Basic Metabolic Panel: Recent Labs  Lab 03/23/22 1741 03/24/22 0436  NA 140 138  K 4.7 4.0  CL 108 109  CO2 24 21*  GLUCOSE 149* 145*  BUN 20 22  CREATININE 1.63* 1.93*  CALCIUM 9.7 8.4*  MG 2.3 1.7   GFR: CrCl cannot be calculated (Unknown ideal weight.).  Liver Function Tests: Recent Labs  Lab 03/23/22 1741 03/24/22 0436  AST 29 20  ALT 28 19  ALKPHOS 59 42  BILITOT 0.8 0.7  PROT 8.3* 6.2*  ALBUMIN 4.4 3.3*    Scheduled Meds:  Continuous Infusions:  sodium chloride 100 mL/hr at 03/24/22 0639   piperacillin-tazobactam (ZOSYN)  IV 3.375 g (03/24/22 0558)     LOS: 1 day   Cherene Altes, MD [facility] Office  [phone] Pager - Text Page per Amion  If 7PM-7AM, please contact night-coverage per [facility]  03/24/2022, 8:19 AM

## 2022-03-24 NOTE — Assessment & Plan Note (Signed)
  #)   Dehydration: Clinical suspicion for such, including the appearance of dry oral mucous membranes as well as laboratory findings notable for UA demonstrating elevated specific gravity. In setting of GI loss of decreased po intake, as above.  Complicated by AKI, as above.  No e/o associated hypotension.   Plan: Monitor strict I's and O's.  Daily weights.  Repeat BMP in the morning. IVF's in form of continuous LR.  Marland Kitchen

## 2022-03-24 NOTE — Assessment & Plan Note (Signed)
 #)   Acute Kidney Injury: Presenting serum creatinine noted to be 1.63 relative to most recent prior did 1 of 1.41 on 11/24/2020.  This appears prerenal in nature on the basis of dehydration stemming from recent increase in GI losses as well as concomitant decline in oral intake over that timeframe.  Appears consistent with urinalysis demonstrating elevated specific gravity, along with 30 protein.  Potential pharmacologic exacerbation in the form of some appropriate.  Of note, CT abdomen/pelvis showed no evidence of postrenal obstructive source.  Plan: monitor strict I's & O's and daily weights. Attempt to avoid nephrotoxic agents.  Hold lisinopril.  Refrain from NSAIDs. Repeat CMP in the morning. Check serum magnesium level. Add-on random urine sodium and random urine creatinine.  Further evaluation management of severe sepsis due to suspected infectious colitis, as above continuous LR.

## 2022-03-24 NOTE — Assessment & Plan Note (Signed)
  #)   Severe sepsis due to suspected infectious colitis: Diagnosis on the basis of 2 weeks of progressive diffuse abdominal discomfort associated with 3-4 episodes of loose stool per day over that timeframe, with CT abdomen/pelvis showing evidence of diffuse colitis concerning for infectious versus inflammatory etiology.  This is also in the context of no improvement in symptoms following compliance with completing course of Flagyl/ciprofloxacin, as above.  Of note, no known history of inflammatory bowel disease.  SIRS criteria met via leukocytosis, tachypnea. Lactic acid level: 1.8. Of note, given the associated presence of suspected end organ damage in the form of concominant presenting acute kidney injury, criteria are met for pt's sepsis to be considered severe in nature. However, in the absence of lactic acid level that is greater than or equal to 4.0, and in the absence of any associated hypotension refractory to IVF's, there are no indications for administration of a 30 mL/kg IVF bolus at this time.   Additional ED work-up/management notable for: Initiation of ciprofloxacin and Flagyl.  However, as the patient's symptoms appear to have worsened after completion of these 2 antibiotics as outpatient, will escalate to Zosyn at this time, while pursuing additional infectious work-up, as further detailed below.  Ciprofloxacin and Flagyl were started prior to collection of blood cultures.  No e/o additional infectious process at this time, including urinalysis that was not consistent with UTI.  Of note, no acute respiratory symptoms or chest x-ray at this time.    Plan: CBC w/ diff and CMP in AM.   Abx: Discontinue IV Flagyl and IV ciprofloxacin in favor escalation to Zosyn, as above.  Continuous lactated Ringer's.  Clear liquid diet to promote a degree of bowel rest.  An IV fentanyl..  IV Zofran.  Add on serum magnesium level.  Check C. difficile.  Refraining from antimotility agents at this time we  will pursue the above infectious work-up.

## 2022-03-24 NOTE — Assessment & Plan Note (Signed)
 #)   Hyperlipidemia: documented h/o such. On rosuvastatin as outpatient.    Plan: We will hold home rosuvastatin for now to promote bowel rest in the setting of suspected infectious colitis, as above.

## 2022-03-24 NOTE — Assessment & Plan Note (Signed)
(  please see colitis)

## 2022-03-24 NOTE — H&P (Signed)
History and Physical    PLEASE NOTE THAT DRAGON DICTATION SOFTWARE WAS USED IN THE CONSTRUCTION OF THIS NOTE.   Frederick Cisneros. EPP:[id number] DOB: 08-04-1941 DOA: 03/23/2022  PCP: Frederick Frees, MD  Patient coming from: home   I have personally briefly reviewed patient's old medical records in [place]  Chief Complaint: Abdominal pain  HPI: Frederick Cisneros. is a 81 y.o. male with medical history significant for essential hypertension, hyperlipidemia, who is admitted to [facility] on 03/23/2022 with severe sepsis due to colitis after presenting from home to [facility] ED complaining of abdominal pain.   The following history is obtained via my discussions with the patient as well as the patient's wife, is present at bedside, in addition to discussions with the EDP and via chart review.  Patient reports 2 weeks of progressive diffuse abdominal discomfort, which she describes as constant and sharp in nature, worse with palpation and worse with attempts to eat or drink anything over that timeframe.  He also notes associated subjective fever over that timeframe in the absence of any associated chills, full body rigors, or generalized myalgias.  He also notes new onset loose stool over the last 2 weeks, stating at least 3-4 daily episodes of nonbloody, watery bowel movements per day over that timeframe.  In this setting, he was diagnosed with colitis as an outpatient, and compliantly completed a 1 week course of ciprofloxacin and Flagyl, with final days of these antibiotics occurring on 03/16/2022.  He notes no significant improvement in the above symptoms will on this antibiotic regimen.  Rather, he has further progression of the symptoms, prompting him to present to [facility] emergency department this evening for further evaluation management thereof.  No recent trauma or travel.  Not on a PPI as an outpatient.  The patient denies any known history of Crohn's or ulcerative  colitis.  No associated dysuria or gross hematuria.  No associated any chest pain, shortness of breath, cough, rash, headache, neck stiffness.  He believes that his most recent screening colonoscopy occurred in 2014, and does not recall any specific abnormalities identified at that time.      ED Course:  Vital signs in the ED were notable for the following: Afebrile; heart rate 80-95; blood pressure 137/57; respiratory rate 18-24, O2 sat 95 to 97% on room air.  Labs were notable for the following: CMP notable for the following: Creatinine 1.63 compared to most recent prior value of 1.41-6 22, liver enzymes within normal limits.  CBC notable for white cell count 11,400 with 84% neutrophils.  Lactic acid 1.8.  Urinalysis demonstrated no white blood cells, no bacteria, but showed 30 protein and specific gravity 1.042.  Imaging and additional notable ED work-up: CT abdomen/pelvis with contrast showed colitis extending from the mid descending to sigmoid colon consistent with infectious versus inflammatory colitis, without any evidence of diverticulitis nor any evidence of bowel obstruction, perforation, or abscess.  While in the ED, the following were administered: Ciprofloxacin, Flagyl.  Normal saline x1 L bolus.  Subsequently, the patient was admitted for evaluation management presents for sepsis due to suspected infectious colitis, with presentation also notable for dehydration and acute injury.      Review of Systems: As per HPI otherwise 10 point review of systems negative.   Past Medical History:  Diagnosis Date   Arthritis    BPH (benign prostatic hyperplasia)    CAD (coronary artery disease)    Diverticulitis    Dyslipidemia  Esophageal reflux    Glaucoma    per pt   Hiatal hernia    Hypertension    LVH (left ventricular hypertrophy)    Sleep apnea    Type II or unspecified type diabetes mellitus without mention of complication, not stated as uncontrolled     Past  Surgical History:  Procedure Laterality Date   CARDIAC CATHETERIZATION     CAROTID ENDARTERECTOMY     COLONOSCOPY     IR GENERIC HISTORICAL  04/29/2016   IR RADIOLOGIST EVAL & MGMT 04/29/2016 Corrie Mckusick, DO [facility] INTERV RAD   IR RADIOLOGIST EVAL & MGMT  12/22/2016   IR RADIOLOGIST EVAL & MGMT  12/23/2017   IR RADIOLOGIST EVAL & MGMT  12/14/2018   IR RADIOLOGIST EVAL & MGMT  12/28/2019   IR RADIOLOGIST EVAL & MGMT  01/15/2021   IR RADIOLOGIST EVAL & MGMT  12/29/2021   ROTATOR CUFF REPAIR     right   STOMACH SURGERY     TRANSURETHRAL RESECTION OF PROSTATE      Social History:  reports that he quit smoking about 33 years ago. He has never used smokeless tobacco. He reports current alcohol use. He reports that he does not use drugs.   No Known Allergies  Family History  Problem Relation Age of Onset   Breast cancer Sister    Diabetes Sister        x 3   GI problems Father        type unknown   Colon cancer Neg Hx    Esophageal cancer Neg Hx    Stomach cancer Neg Hx    Rectal cancer Neg Hx       Prior to Admission medications   Medication Sig Start Date End Date Taking? Authorizing Provider  amLODipine (NORVASC) 5 MG tablet Take 5 mg by mouth daily.    Yes [provider]  aspirin 325 MG tablet Take 325 mg by mouth daily.   Yes [provider]  colchicine 0.6 MG tablet Take 0.6 mg by mouth 2 (two) times daily as needed (gout). 09/29/19  Yes [provider]  dorzolamide-timolol (COSOPT) 22.3-6.8 MG/ML ophthalmic solution Place 1 drop into both eyes 2 (two) times daily. 08/22/19  Yes [provider]  ketoconazole (NIZORAL) 2 % cream Apply 1 application topically daily as needed for irritation (itching). Reported on 11/05/2015 08/08/13  Yes [provider]  lisinopril (PRINIVIL,ZESTRIL) 10 MG tablet Take 10 mg by mouth daily.  08/29/13  Yes [provider]  metoprolol succinate (TOPROL-XL) 100 MG 24 hr tablet Take 100 mg by mouth  daily. Take with or immediately following a meal.   Yes [provider]  Multiple Vitamin (MULTIVITAMIN WITH MINERALS) TABS tablet Take 1 tablet by mouth daily.   Yes [provider]  rosuvastatin (CRESTOR) 40 MG tablet Take 40 mg by mouth daily. Reported on 11/05/2015   Yes [provider]  Travoprost, BAK Free, (TRAVATAN) 0.004 % SOLN ophthalmic solution Place 1 drop into both eyes at bedtime. 08/22/19  Yes [provider]  clidinium-chlordiazePOXIDE (LIBRAX) 2.5-5 MG per capsule Take 1 capsule by mouth 2 (two) times daily as needed. Patient not taking: Reported on 03/23/2022 12/13/12   Sable Feil, MD  oxyCODONE-acetaminophen (PERCOCET/ROXICET) 5-325 MG tablet Take 1 tablet by mouth every 6 (six) hours as needed for severe pain. Patient not taking: Reported on 03/23/2022 11/24/20   Eustaquio Maize, PA-C  sucralfate (CARAFATE) 1 GM/10ML suspension One tablespoon after meals and  at bedtime Patient not taking: Reported on 12/14/2018 09/26/13   Sable Feil, MD     Objective    Physical Exam: Vitals:   03/24/22 0300 03/24/22 0400 03/24/22 0500 03/24/22 0600  BP: (!) 136/58 (!) 114/46 (!) 134/56 (!) 116/55  Pulse: 80 77 76 75  Resp: (!) 24 18 (!) 23 19  Temp:      TempSrc:      SpO2: 95% 93% 95% 96%    General: appears to be stated age; alert, oriented Skin: warm, dry, no rash Head:  AT/[place] Mouth:  Oral mucosa membranes appear dry, normal dentition Neck: supple; trachea midline Heart:  RRR; did not appreciate any M/R/G Lungs: CTAB, did not appreciate any wheezes, rales, or rhonchi Abdomen: Hypoactive bowel sounds, soft, ND, Hyperactive bowel sounds, diffuse tenderness in the absence of any associated guarding, rigidity, rebound tenderness. Vascular: 2+ pedal pulses b/l; 2+ radial pulses b/l Extremities: no peripheral edema, no muscle wasting Neuro: strength and sensation intact in upper and lower extremities b/l      Labs on Admission: I  have personally reviewed following labs and imaging studies  CBC: Recent Labs  Lab 03/23/22 1741 03/24/22 0436  WBC 11.4* 9.2  NEUTROABS 9.6* 6.7  HGB 15.7 12.5*  HCT 47.1 37.8*  MCV 92.2 92.6  PLT 190 010   Basic Metabolic Panel: Recent Labs  Lab 03/23/22 1741 03/24/22 0436  NA 140 138  K 4.7 4.0  CL 108 109  CO2 24 21*  GLUCOSE 149* 145*  BUN 20 22  CREATININE 1.63* 1.93*  CALCIUM 9.7 8.4*  MG 2.3 1.7   GFR: CrCl cannot be calculated (Unknown ideal weight.). Liver Function Tests: Recent Labs  Lab 03/23/22 1741 03/24/22 0436  AST 29 20  ALT 28 19  ALKPHOS 59 42  BILITOT 0.8 0.7  PROT 8.3* 6.2*  ALBUMIN 4.4 3.3*   No results for input(s): LIPASE, AMYLASE in the last 168 hours. No results for input(s): AMMONIA in the last 168 hours. Coagulation Profile: No results for input(s): INR, PROTIME in the last 168 hours. Cardiac Enzymes: No results for input(s): CKTOTAL, CKMB, CKMBINDEX, TROPONINI in the last 168 hours. BNP (last 3 results) No results for input(s): PROBNP in the last 8760 hours. HbA1C: No results for input(s): HGBA1C in the last 72 hours. CBG: No results for input(s): GLUCAP in the last 168 hours. Lipid Profile: No results for input(s): CHOL, HDL, LDLCALC, TRIG, CHOLHDL, LDLDIRECT in the last 72 hours. Thyroid Function Tests: No results for input(s): TSH, T4TOTAL, FREET4, T3FREE, THYROIDAB in the last 72 hours. Anemia Panel: No results for input(s): VITAMINB12, FOLATE, FERRITIN, TIBC, IRON, RETICCTPCT in the last 72 hours. Urine analysis:    Component Value Date/Time   COLORURINE YELLOW 03/24/2022 0559   APPEARANCEUR HAZY (A) 03/24/2022 0559   LABSPEC 1.042 (H) 03/24/2022 0559   PHURINE 5.0 03/24/2022 0559   GLUCOSEU NEGATIVE 03/24/2022 0559   HGBUR SMALL (A) 03/24/2022 0559   BILIRUBINUR NEGATIVE 03/24/2022 0559   KETONESUR NEGATIVE 03/24/2022 0559   PROTEINUR 30 (A) 03/24/2022 0559   NITRITE NEGATIVE 03/24/2022 0559   LEUKOCYTESUR  NEGATIVE 03/24/2022 0559    Radiological Exams on Admission: CT Abdomen Pelvis W Contrast  Result Date: 03/23/2022 CLINICAL DATA:  Acute abdominal pain. Diarrhea. Recent antibiotics for diverticulitis. EXAM: CT ABDOMEN AND PELVIS WITH CONTRAST TECHNIQUE: Multidetector CT imaging of the abdomen and pelvis was performed using the standard protocol following bolus administration of intravenous contrast. RADIATION DOSE REDUCTION: This exam was  performed according to the departmental dose-optimization program which includes automated exposure control, adjustment of the mA and/or kV according to patient size and/or use of iterative reconstruction technique. CONTRAST:  91m OMNIPAQUE IOHEXOL 300 MG/ML  SOLN COMPARISON:  Noncontrast CT 11/24/2020 FINDINGS: Lower chest: No focal airspace disease or pleural effusion. Hepatobiliary: No focal liver abnormality is seen. No gallstones, gallbladder wall thickening, or biliary dilatation. Pancreas: No ductal dilatation or inflammation. Spleen: Normal in size without focal abnormality. Adrenals/Urinary Tract: Normal adrenal glands. There is minimal left perinephric edema. Suggestion of mild proximal left ureteral enhancement in soft tissue thickening. Punctate nonobstructing stone in the mid left kidney. No ureteral calculi. Right kidney is inferiorly located, but without acute findings. The urinary bladder is minimally distended, the right aspect of the bladder extends into a right inguinal hernia. No bladder wall thickening. Stomach/Bowel: There is colonic wall thickening extending from the mid descending through the sigmoid colon with mild pericolonic edema. There are multiple diverticula in the region of colonic wall thickening, but no definite discretely inflamed diverticulum. The appendix is normal. There is no small bowel obstruction or small bowel inflammation. Unremarkable appearance of the stomach. Vascular/Lymphatic: Advanced aortic and branch atherosclerosis. Dense  calcification of the iliac arteries. No acute vascular findings. Patent portal vein. No abdominopelvic adenopathy. Reproductive: Enlarged prostate gland. Other: Right inguinal hernia contains fat and right aspect of the bladder dome. There is a small fat containing left inguinal hernia. No abdominopelvic ascites or free air. No bowel perforation or focal fluid collection. Musculoskeletal: There are no acute or suspicious osseous abnormalities. IMPRESSION: 1. Colitis extending from the mid descending through the sigmoid colon, likely infectious or inflammatory. There are multiple diverticula in the region of colonic wall thickening, but no definite discretely inflamed diverticulum. 2. There is mild left perinephric edema with slight enhancement and thickening of the left renal pelvis, query pyelonephritis. 3. Punctate nonobstructing stone in the mid left kidney. 4. Right inguinal hernia contains fat and right aspect of the bladder dome. Small fat containing left inguinal hernia. 5. Enlarged prostate gland. Aortic Atherosclerosis (ICD10-I70.0). Electronically Signed   By: MKeith RakeM.D.   On: 03/23/2022 19:11      Assessment/Plan    Principal Problem:   Colitis Active Problems:   HYPERCHOLESTEROLEMIA   HTN (hypertension)   Severe sepsis (HCC)   AKI (acute kidney injury) ([place]   Dehydration       #) Severe sepsis due to suspected infectious colitis: Diagnosis on the basis of 2 weeks of progressive diffuse abdominal discomfort associated with 3-4 episodes of loose stool per day over that timeframe, with CT abdomen/pelvis showing evidence of diffuse colitis concerning for infectious versus inflammatory etiology.  This is also in the context of no improvement in symptoms following compliance with completing course of Flagyl/ciprofloxacin, as above.  Of note, no known history of inflammatory bowel disease.  SIRS criteria met via leukocytosis, tachypnea. Lactic acid level: 1.8. Of note, given  the associated presence of suspected end organ damage in the form of concominant presenting acute kidney injury, criteria are met for pt's sepsis to be considered severe in nature. However, in the absence of lactic acid level that is greater than or equal to 4.0, and in the absence of any associated hypotension refractory to IVF's, there are no indications for administration of a 30 mL/kg IVF bolus at this time.   Additional ED work-up/management notable for: Initiation of ciprofloxacin and Flagyl.  However, as the patient's symptoms appear to have worsened  after completion of these 2 antibiotics as outpatient, will escalate to Zosyn at this time, while pursuing additional infectious work-up, as further detailed below.  Ciprofloxacin and Flagyl were started prior to collection of blood cultures.  No e/o additional infectious process at this time, including urinalysis that was not consistent with UTI.  Of note, no acute respiratory symptoms or chest x-ray at this time.    Plan: CBC w/ diff and CMP in AM.   Abx: Discontinue IV Flagyl and IV ciprofloxacin in favor escalation to Zosyn, as above.  Continuous lactated Ringer's.  Clear liquid diet to promote a degree of bowel rest.  An IV fentanyl..  IV Zofran.  Add on serum magnesium level.  Check C. difficile.  Refraining from antimotility agents at this time we will pursue the above infectious work-up.         #) Acute Kidney Injury: Presenting serum creatinine noted to be 1.63 relative to most recent prior did 1 of 1.41 on 11/24/2020.  This appears prerenal in nature on the basis of dehydration stemming from recent increase in GI losses as well as concomitant decline in oral intake over that timeframe.  Appears consistent with urinalysis demonstrating elevated specific gravity, along with 30 protein.  Potential pharmacologic exacerbation in the form of some appropriate.  Of note, CT abdomen/pelvis showed no evidence of postrenal obstructive  source.  Plan: monitor strict I's & O's and daily weights. Attempt to avoid nephrotoxic agents.  Hold lisinopril.  Refrain from NSAIDs. Repeat CMP in the morning. Check serum magnesium level. Add-on random urine sodium and random urine creatinine.  Further evaluation management of severe sepsis due to suspected infectious colitis, as above continuous LR.         #) Dehydration: Clinical suspicion for such, including the appearance of dry oral mucous membranes as well as laboratory findings notable for UA demonstrating elevated specific gravity. In setting of GI loss of decreased po intake, as above.  Complicated by AKI, as above.  No e/o associated hypotension.   Plan: Monitor strict I's and O's.  Daily weights.  Repeat BMP in the morning. IVF's in form of continuous LR.  Marland Kitchen        #) Hyperlipidemia: documented h/o such. On rosuvastatin as outpatient.    Plan: We will hold home rosuvastatin for now to promote bowel rest in the setting of suspected infectious colitis, as above.           #) Essential Hypertension: documented h/o such, with outpatient antihypertensive regimen including lisinopril, Norvasc, metoprolol succinate.  SBP's in the ED today: Normotensive.  In setting of severe sepsis, will hold home antihypertensive medications for now.  Plan: Close monitoring of subsequent BP via routine VS. Hold home bp meds for now, as above.        DVT prophylaxis: SCD's   Code Status: Full code Family Communication: I discussed the patient's case with his wife, who is present at bedside. Disposition Plan: Per Rounding Team Consults called: none;  Admission status: Inpatient   PLEASE NOTE THAT DRAGON DICTATION SOFTWARE WAS USED IN THE CONSTRUCTION OF THIS NOTE.   [place] DO [facility] [place]   03/24/2022, 6:49 AM

## 2022-03-24 NOTE — Assessment & Plan Note (Signed)
   #)   Essential Hypertension: documented h/o such, with outpatient antihypertensive regimen including lisinopril, Norvasc, metoprolol succinate.  SBP's in the ED today: Normotensive.  In setting of severe sepsis, will hold home antihypertensive medications for now.  Plan: Close monitoring of subsequent BP via routine VS. Hold home bp meds for now, as above.

## 2022-03-25 ENCOUNTER — Other Ambulatory Visit (HOSPITAL_COMMUNITY): Payer: Self-pay

## 2022-03-25 DIAGNOSIS — K529 Noninfective gastroenteritis and colitis, unspecified: Secondary | ICD-10-CM | POA: Diagnosis not present

## 2022-03-25 LAB — CBC
HCT: 38 % — ABNORMAL LOW (ref 39.0–52.0)
Hemoglobin: 12.6 g/dL — ABNORMAL LOW (ref 13.0–17.0)
MCH: 30.7 pg (ref 26.0–34.0)
MCHC: 33.2 g/dL (ref 30.0–36.0)
MCV: 92.5 fL (ref 80.0–100.0)
Platelets: 151 10*3/uL (ref 150–400)
RBC: 4.11 MIL/uL — ABNORMAL LOW (ref 4.22–5.81)
RDW: 14.3 % (ref 11.5–15.5)
WBC: 12.4 10*3/uL — ABNORMAL HIGH (ref 4.0–10.5)
nRBC: 0 % (ref 0.0–0.2)

## 2022-03-25 LAB — COMPREHENSIVE METABOLIC PANEL
ALT: 21 U/L (ref 0–44)
AST: 26 U/L (ref 15–41)
Albumin: 3.2 g/dL — ABNORMAL LOW (ref 3.5–5.0)
Alkaline Phosphatase: 44 U/L (ref 38–126)
Anion gap: 7 (ref 5–15)
BUN: 22 mg/dL (ref 8–23)
CO2: 21 mmol/L — ABNORMAL LOW (ref 22–32)
Calcium: 8.6 mg/dL — ABNORMAL LOW (ref 8.9–10.3)
Chloride: 111 mmol/L (ref 98–111)
Creatinine, Ser: 1.87 mg/dL — ABNORMAL HIGH (ref 0.61–1.24)
GFR, Estimated: 36 mL/min — ABNORMAL LOW (ref >60)
Glucose, Bld: 125 mg/dL — ABNORMAL HIGH (ref 70–99)
Potassium: 3.7 mmol/L (ref 3.5–5.1)
Sodium: 139 mmol/L (ref 135–145)
Total Bilirubin: 0.9 mg/dL (ref 0.3–1.2)
Total Protein: 6.7 g/dL (ref 6.5–8.1)

## 2022-03-25 LAB — URINE CULTURE: Culture: <10000 — AB

## 2022-03-25 LAB — MAGNESIUM: Magnesium: 2 mg/dL (ref 1.7–2.4)

## 2022-03-25 LAB — PHOSPHORUS: Phosphorus: 2.4 mg/dL — ABNORMAL LOW (ref 2.5–4.6)

## 2022-03-25 MED ORDER — VANCOMYCIN HCL 125 MG PO CAPS
125.0000 mg | ORAL_CAPSULE | Freq: Four times a day (QID) | ORAL | Status: DC
  Administered 2022-03-25 – 2022-03-28 (×13): 125 mg via ORAL
  Filled 2022-03-25 (×15): qty 1

## 2022-03-25 NOTE — Plan of Care (Signed)

## 2022-03-25 NOTE — Progress Notes (Signed)
BRIEF PHARMACY CONSULT NOTE  Frederick Segundo. is an 81 yo male presenting to [facility] ED with abdominal pain and loose stools over the past 2 weeks.  Recently completed ciprofloxacin and metronidazole on 03/16/22 for colitis.  Pharmacy is consulted to dose vancomycin for C.difficile.  Antimicrobials this admission:  Zosyn 6/5 >> 6/7 Vancomycin PO 6/7 >> 6/16  Microbiology results:  6/6 Cdiff: antigen pos, toxin neg, PCR pos 6/6 UCx: insignificant growth   Plan: - Begin vancomycin 125mg  PO QID x 10 days - Monitor for improvement in symptoms  Pharmacy will formally sign off consult but continue to monitor patient peripherally.  , PharmD 03/25/2022 10:05 AM

## 2022-03-25 NOTE — Progress Notes (Signed)
PROGRESS NOTE  Frederick Cisneros.  DOB: 1940-10-26  PCP: Johny Blamer, MD QPY:[id number]  DOA: 03/23/2022  LOS: 2 days  Hospital Day: 3  Brief narrative: Frederick Cisneros. is a 81 y.o. male with PMH significant for DM2, HTN, HLD, CAD, sleep apnea, GERD, BPH, arthritis Patient presented to the ED on 6/5 with complaint of progressively worsening abdominal pain over a 2-week timeframe.  Pain worsened by any attempt to eat or drink.  Also reports subjective fever, generalized myalgia and loose stool. He was diagnosed with colitis as an outpatient and was started on a course of ciprofloxacin and Flagyl which he completed on 03/16/2022 without significant improvement in his symptoms  In the ED, CT abdomen/pelvis showed colitis extending from the mid descending to sigmoid colon without evidence of obstruction perforation or abscess. Patient was admitted to hospitalist service See below for details   Subjective: Patient was seen and examined this morning.  Pleasant elderly African-American male.  Lying on bed.  Not in distress.  Continues to have abdominal pain.  Reports 10-15 bowel movement since yesterday morning. Chart reviewed No fever in last 24 hours, diastolic blood pressure low mostly, on room air Last set of labs from this morning showed creatinine at 1.87, phosphorus low at 2.4, WBC count elevated 12.4 C. difficile assay from 6/6 was positive for C. difficile antigen as well as toxin  Principal Problem:   Colitis Active Problems:   HYPERCHOLESTEROLEMIA   HTN (hypertension)   Severe sepsis (HCC)   AKI (acute kidney injury) (HCC)   Dehydration    Assessment and plan: Sepsis secondary to C. difficile colitis -Presented with abdominal pain, diarrhea, poor oral intake, tachypnea, leukocytosis  -CT abdomen with colitis involving descending and sigmoid colon  -6/6, C. difficile antigen and toxin positive  -I started the patient on oral vancomycin.  Continue to monitor the amount  and frequency of diarrhea.  Flexi-Seal may help   Acute kidney injury -Creatinine 1.63 at presentation with baseline appearing to be approximately 1.4 dating to February 2022.  Creatinine worsening.  Expect improvement with resolution of diarrhea.  Continue to monitor on IV fluid. Recent Labs    03/23/22 1741 03/24/22 0436 03/25/22 0451  BUN 20 22 22   CREATININE 1.63* 1.93* 1.87*   HTN -PTA on Toprol 100 mg daily, amlodipine 5 mg daily, lisinopril 10 mg daily -Currently on low-dose of Toprol at 25 mg daily.  Others on hold. -Continue to monitor blood pressure.  HLD -PTA, on Crestor.  Resume once able to tolerate oral intake.   Goals of care   Code Status: Full Code    Mobility: Encourage ambulation  Skin assessment:     Nutritional status:  Body mass index is 26.74 kg/m.          Diet:  Diet Order             Diet clear liquid Room service appropriate? Yes; Fluid consistency: Thin  Diet effective now                   DVT prophylaxis:  SCDs Start: 03/23/22 2049   Antimicrobials: Oral vancomycin Fluid: NS at 75 Consultants: None Family Communication: Son at bedside  Status is: Inpatient  Continue in-hospital care because: Continues to have significant watery diarrhea Level of care: Med-Surg   Dispo: The patient is from: Home              Anticipated d/c is to: Hopefully home in 2 to 3 days  Patient currently is not medically stable to d/c.   Difficult to place patient No     Infusions:   sodium chloride 75 mL/hr at 03/25/22 0045    Scheduled Meds:  dorzolamide-timolol  1 drop Both Eyes BID   latanoprost  1 drop Both Eyes QHS   metoprolol succinate  25 mg Oral Daily   vancomycin  125 mg Oral QID    PRN meds: acetaminophen **OR** [DISCONTINUED] acetaminophen, fentaNYL (SUBLIMAZE) injection, naLOXone (NARCAN)  injection, ondansetron (ZOFRAN) IV   Antimicrobials: Anti-infectives (From admission, onward)    Start      Dose/Rate Route Frequency Ordered Stop   03/25/22 1045  vancomycin (VANCOCIN) capsule 125 mg        125 mg Oral 4 times daily 03/25/22 0958 04/04/22 0959   03/23/22 2200  piperacillin-tazobactam (ZOSYN) IVPB 3.375 g  Status:  Discontinued        3.375 g 12.5 mL/hr over 240 Minutes Intravenous Every 8 hours 03/23/22 2101 03/25/22 0941   03/23/22 2015  ciprofloxacin (CIPRO) IVPB 400 mg  Status:  Discontinued        400 mg 200 mL/hr over 60 Minutes Intravenous Every 12 hours 03/23/22 2001 03/23/22 2046   03/23/22 2015  metroNIDAZOLE (FLAGYL) IVPB 500 mg  Status:  Discontinued        500 mg 100 mL/hr over 60 Minutes Intravenous Every 12 hours 03/23/22 2001 03/23/22 2046       Objective: Vitals:   03/25/22 0141 03/25/22 0431  BP: (!) 121/51 (!) 133/53  Pulse: 84 80  Resp: 20 18  Temp: 97.9 F (36.6 C) 98.1 F (36.7 C)  SpO2: 97% 98%    Intake/Output Summary (Last 24 hours) at 03/25/2022 1124 Last data filed at 03/25/2022 0300 Gross per 24 hour  Intake 2239.82 ml  Output --  Net 2239.82 ml   Filed Weights   03/24/22 1236  Weight: 74 kg   Weight change:  Body mass index is 26.74 kg/m.   Physical Exam: General exam: Pleasant, elderly African-American male.  No distress.  Recurrent diarrhea and abdominal pain Skin: No rashes, lesions or ulcers. HEENT: Atraumatic, normocephalic, no obvious bleeding Lungs: Clear to auscultation bilaterally CVS: Regular rate and rhythm, no murmur GI/Abd soft, mild diffuse tenderness, mild distention present, bowel sound present CNS: Alert, awake, oriented x3 Psychiatry: Sad affect Extremities: No pedal edema, no calf tenderness  Data Review: I have personally reviewed the laboratory data and studies available.  F/u labs ordered Unresulted Labs (From admission, onward)     Start     Ordered   03/26/22 0500  CBC with Differential/Platelet  Daily,   R     Question:  Specimen collection method  Answer:  Lab=Lab collect   03/25/22 1124    03/26/22 0500  Basic metabolic panel  Daily,   R     Question:  Specimen collection method  Answer:  Lab=Lab collect   03/25/22 1124   03/26/22 0500  Phosphorus  Tomorrow morning,   R       Question:  Specimen collection method  Answer:  Lab=Lab collect   03/25/22 1124   03/26/22 0500  Magnesium  Tomorrow morning,   R       Question:  Specimen collection method  Answer:  Lab=Lab collect   03/25/22 1124            Signed, Lorin Glass, MD [facility] 03/25/2022

## 2022-03-25 NOTE — Progress Notes (Signed)
  Transition of Care [facility]) Screening Note   Patient Details  Name: Frederick Cisneros. Date of Birth: Dec 14, 1940   Transition of Care [facility]) CM/SW Contact:    Otelia Santee, LCSW Phone Number: 03/25/2022, 1:03 PM    Transition of Care Department [facility]) has reviewed patient and no TOC needs have been identified at this time. We will continue to monitor patient advancement through interdisciplinary progression rounds. If new patient transition needs arise, please place a TOC consult.

## 2022-03-25 NOTE — TOC Benefit Eligibility Note (Signed)
Patient Product/process development scientist completed.    The patient is currently admitted and upon discharge could be taking vancomycin 125 mg capsules.  The current 30 day co-pay is, $100.00.   The patient is insured through [employer] Part D     Roland Earl, CPhT Pharmacy Patient Advocate Specialist [facility] Patient Advocate Team Direct Number: [phone]  Fax: [phone]

## 2022-03-26 DIAGNOSIS — K529 Noninfective gastroenteritis and colitis, unspecified: Secondary | ICD-10-CM | POA: Diagnosis not present

## 2022-03-26 LAB — BASIC METABOLIC PANEL
Anion gap: 6 (ref 5–15)
BUN: 13 mg/dL (ref 8–23)
CO2: 23 mmol/L (ref 22–32)
Calcium: 8.7 mg/dL — ABNORMAL LOW (ref 8.9–10.3)
Chloride: 112 mmol/L — ABNORMAL HIGH (ref 98–111)
Creatinine, Ser: 1.6 mg/dL — ABNORMAL HIGH (ref 0.61–1.24)
GFR, Estimated: 43 mL/min — ABNORMAL LOW (ref >60)
Glucose, Bld: 119 mg/dL — ABNORMAL HIGH (ref 70–99)
Potassium: 3.8 mmol/L (ref 3.5–5.1)
Sodium: 141 mmol/L (ref 135–145)

## 2022-03-26 LAB — MAGNESIUM: Magnesium: 2.1 mg/dL (ref 1.7–2.4)

## 2022-03-26 LAB — CBC WITH DIFFERENTIAL/PLATELET
Abs Immature Granulocytes: 0.07 10*3/uL (ref 0.00–0.07)
Basophils Absolute: 0.1 10*3/uL (ref 0.0–0.1)
Basophils Relative: 0 %
Eosinophils Absolute: 0.1 10*3/uL (ref 0.0–0.5)
Eosinophils Relative: 1 %
HCT: 37.1 % — ABNORMAL LOW (ref 39.0–52.0)
Hemoglobin: 12.5 g/dL — ABNORMAL LOW (ref 13.0–17.0)
Immature Granulocytes: 1 %
Lymphocytes Relative: 16 %
Lymphs Abs: 1.9 10*3/uL (ref 0.7–4.0)
MCH: 30.8 pg (ref 26.0–34.0)
MCHC: 33.7 g/dL (ref 30.0–36.0)
MCV: 91.4 fL (ref 80.0–100.0)
Monocytes Absolute: 1.1 10*3/uL — ABNORMAL HIGH (ref 0.1–1.0)
Monocytes Relative: 10 %
Neutro Abs: 8.4 10*3/uL — ABNORMAL HIGH (ref 1.7–7.7)
Neutrophils Relative %: 72 %
Platelets: 165 10*3/uL (ref 150–400)
RBC: 4.06 MIL/uL — ABNORMAL LOW (ref 4.22–5.81)
RDW: 14.3 % (ref 11.5–15.5)
WBC: 11.6 10*3/uL — ABNORMAL HIGH (ref 4.0–10.5)
nRBC: 0 % (ref 0.0–0.2)

## 2022-03-26 LAB — PHOSPHORUS: Phosphorus: 2 mg/dL — ABNORMAL LOW (ref 2.5–4.6)

## 2022-03-26 MED ORDER — ROSUVASTATIN CALCIUM 10 MG PO TABS
40.0000 mg | ORAL_TABLET | Freq: Every day | ORAL | Status: DC
  Administered 2022-03-26 – 2022-03-28 (×3): 40 mg via ORAL
  Filled 2022-03-26 (×3): qty 4

## 2022-03-26 MED ORDER — FAMOTIDINE 20 MG PO TABS
20.0000 mg | ORAL_TABLET | Freq: Every day | ORAL | Status: DC
Start: 2022-03-26 — End: 2022-03-28
  Administered 2022-03-26 – 2022-03-27 (×2): 20 mg via ORAL
  Filled 2022-03-26 (×2): qty 1

## 2022-03-26 MED ORDER — AMLODIPINE BESYLATE 5 MG PO TABS
5.0000 mg | ORAL_TABLET | Freq: Every day | ORAL | Status: DC
  Administered 2022-03-26 – 2022-03-28 (×3): 5 mg via ORAL
  Filled 2022-03-26 (×3): qty 1

## 2022-03-26 NOTE — Plan of Care (Signed)

## 2022-03-26 NOTE — Care Management Important Message (Signed)
Important Message  Patient Details IM Letter given to the Patient. Name: Frederick Cisneros. MRN: [id number] Date of Birth: [birth year]   Medicare Important Message Given:  Yes     Caren Macadam 03/26/2022, 11:19 AM

## 2022-03-26 NOTE — Progress Notes (Signed)
PROGRESS NOTE  Frederick Cisneros.  DOB: 03/12/1941  PCP: Frederick Blamer, MD HEN:[id number]  DOA: 03/23/2022  LOS: 3 days  Hospital Day: 4  Brief narrative: Frederick Cisneros. is a 81 y.o. male with PMH significant for DM2, HTN, HLD, CAD, sleep apnea, GERD, BPH, arthritis Patient presented to the ED on 6/5 with complaint of progressively worsening abdominal pain over a 2-week timeframe.  Pain worsened by any attempt to eat or drink.  Also reports subjective fever, generalized myalgia and loose stool. 3 weeks prior, he was diagnosed with colitis as an outpatient and was started on a course of ciprofloxacin and Flagyl which he completed on 03/16/2022 without significant improvement in his symptoms  In the ED, CT abdomen/pelvis showed colitis extending from the mid descending to sigmoid colon without evidence of obstruction perforation or abscess. Patient was admitted to hospitalist service See below for details   Subjective: Patient was seen and examined this morning.  Sitting up in chair.  Feels better.  Diarrhea improving in terms of frequency and consistency both.    Principal Problem:   Colitis Active Problems:   HYPERCHOLESTEROLEMIA   HTN (hypertension)   Severe sepsis (HCC)   AKI (acute kidney injury) (HCC)   Dehydration    Assessment and plan: Sepsis secondary to C. difficile colitis -Presented with abdominal pain, diarrhea, poor oral intake, tachypnea, leukocytosis  -CT abdomen with colitis involving descending and sigmoid colon  -6/6, C. difficile antigen positive, toxin negative but PCR positive. -I started the patient on oral vancomycin.  Clinically improving in terms of diarrhea frequency and consistency. -Monitor advance diet.  Start on regular diet. -If diarrhea continues to improve, I plan to discharge him home tomorrow. -To follow-up with GI as an outpatient.  Acute kidney injury -Creatinine 1.63 at presentation with baseline appearing to be approximately 1.4  dating to February 2022.  Creatinine worsened to peak at 1.93 on 6/6.  Gradually improving.  Continue IV fluid. Recent Labs    03/23/22 1741 03/24/22 0436 03/25/22 0451 03/26/22 0600  BUN 20 22 22 13   CREATININE 1.63* 1.93* 1.87* 1.60*   HTN -PTA on Toprol 100 mg daily, amlodipine 5 mg daily, lisinopril 10 mg daily -Currently on low-dose of Toprol at 25 mg daily.  Others on hold. -Blood pressure rising up this morning, I would resume amlodipine 5 mg today. -Continue to monitor blood pressure.  HLD -Resume Crestor.   GERD/dyspepsia -Complains of reflux and epigastric pain.  No hematemesis, melena or hematochezia.  Hemoglobin stable.  Has not seen GI in several years.  I started him on Pepcid nightly. -Follow-up with GI as an outpatient. Recent Labs    03/23/22 1741 03/24/22 0436 03/25/22 0451 03/26/22 0600  HGB 15.7 12.5* 12.6* 12.5*  MCV 92.2 92.6 92.5 91.4   Goals of care   Code Status: Full Code    Mobility: Encourage ambulation  Skin assessment:     Nutritional status:  Body mass index is 26.74 kg/m.          Diet:  Diet Order             Diet regular Room service appropriate? Yes; Fluid consistency: Thin  Diet effective now                   DVT prophylaxis:  SCDs Start: 03/23/22 2049   Antimicrobials: Oral vancomycin Fluid: NS at 75 Consultants: None Family Communication: Son at bedside  Status is: Inpatient  Continue in-hospital care because: Diarrhea improving  Level of care: Med-Surg   Dispo: The patient is from: Home              Anticipated d/c is to: Hopefully home in 1 to 2 days              Patient currently is not medically stable to d/c.   Difficult to place patient No     Infusions:   sodium chloride 75 mL/hr at 03/26/22 0706    Scheduled Meds:  dorzolamide-timolol  1 drop Both Eyes BID   latanoprost  1 drop Both Eyes QHS   metoprolol succinate  25 mg Oral Daily   vancomycin  125 mg Oral QID    PRN  meds: acetaminophen **OR** [DISCONTINUED] acetaminophen, fentaNYL (SUBLIMAZE) injection, naLOXone (NARCAN)  injection, ondansetron (ZOFRAN) IV   Antimicrobials: Anti-infectives (From admission, onward)    Start     Dose/Rate Route Frequency Ordered Stop   03/25/22 1045  vancomycin (VANCOCIN) capsule 125 mg        125 mg Oral 4 times daily 03/25/22 0958 04/04/22 0959   03/23/22 2200  piperacillin-tazobactam (ZOSYN) IVPB 3.375 g  Status:  Discontinued        3.375 g 12.5 mL/hr over 240 Minutes Intravenous Every 8 hours 03/23/22 2101 03/25/22 0941   03/23/22 2015  ciprofloxacin (CIPRO) IVPB 400 mg  Status:  Discontinued        400 mg 200 mL/hr over 60 Minutes Intravenous Every 12 hours 03/23/22 2001 03/23/22 2046   03/23/22 2015  metroNIDAZOLE (FLAGYL) IVPB 500 mg  Status:  Discontinued        500 mg 100 mL/hr over 60 Minutes Intravenous Every 12 hours 03/23/22 2001 03/23/22 2046       Objective: Vitals:   03/25/22 2041 03/26/22 0351  BP: (!) 132/59 (!) 153/62  Pulse: 86 74  Resp: 18 16  Temp: 97.8 F (36.6 C) 98.3 F (36.8 C)  SpO2: 99% 98%    Intake/Output Summary (Last 24 hours) at 03/26/2022 1048 Last data filed at 03/26/2022 1027 Gross per 24 hour  Intake 3396.11 ml  Output --  Net 3396.11 ml   Filed Weights   03/24/22 1236  Weight: 74 kg   Weight change:  Body mass index is 26.74 kg/m.   Physical Exam: General exam: Pleasant, elderly African-American male.  No distress.   Skin: No rashes, lesions or ulcers. HEENT: Atraumatic, normocephalic, no obvious bleeding Lungs: Clear to auscultation bilaterally CVS: Regular rate and rhythm, no murmur GI/Abd soft, improving tenderness and distention, bowel sound present CNS: Alert, awake, oriented x3 Psychiatry: Mood appropriate Extremities: No pedal edema, no calf tenderness  Data Review: I have personally reviewed the laboratory data and studies available.  F/u labs ordered Unresulted Labs (From admission, onward)      Start     Ordered   03/26/22 0500  CBC with Differential/Platelet  Daily,   R     Question:  Specimen collection method  Answer:  Lab=Lab collect   03/25/22 1124   03/26/22 0500  Basic metabolic panel  Daily,   R     Question:  Specimen collection method  Answer:  Lab=Lab collect   03/25/22 1124            Signed, Lorin Glass, MD [facility] 03/26/2022

## 2022-03-27 DIAGNOSIS — K529 Noninfective gastroenteritis and colitis, unspecified: Secondary | ICD-10-CM | POA: Diagnosis not present

## 2022-03-27 LAB — BASIC METABOLIC PANEL
Anion gap: 6 (ref 5–15)
BUN: 14 mg/dL (ref 8–23)
CO2: 22 mmol/L (ref 22–32)
Calcium: 8.8 mg/dL — ABNORMAL LOW (ref 8.9–10.3)
Chloride: 113 mmol/L — ABNORMAL HIGH (ref 98–111)
Creatinine, Ser: 1.23 mg/dL (ref 0.61–1.24)
GFR, Estimated: 59 mL/min — ABNORMAL LOW (ref >60)
Glucose, Bld: 113 mg/dL — ABNORMAL HIGH (ref 70–99)
Potassium: 3.5 mmol/L (ref 3.5–5.1)
Sodium: 141 mmol/L (ref 135–145)

## 2022-03-27 LAB — CBC WITH DIFFERENTIAL/PLATELET
Abs Immature Granulocytes: 0.07 10*3/uL (ref 0.00–0.07)
Basophils Absolute: 0 10*3/uL (ref 0.0–0.1)
Basophils Relative: 1 %
Eosinophils Absolute: 0.1 10*3/uL (ref 0.0–0.5)
Eosinophils Relative: 1 %
HCT: 37.8 % — ABNORMAL LOW (ref 39.0–52.0)
Hemoglobin: 12.7 g/dL — ABNORMAL LOW (ref 13.0–17.0)
Immature Granulocytes: 1 %
Lymphocytes Relative: 29 %
Lymphs Abs: 2.2 10*3/uL (ref 0.7–4.0)
MCH: 30.9 pg (ref 26.0–34.0)
MCHC: 33.6 g/dL (ref 30.0–36.0)
MCV: 92 fL (ref 80.0–100.0)
Monocytes Absolute: 0.9 10*3/uL (ref 0.1–1.0)
Monocytes Relative: 12 %
Neutro Abs: 4.3 10*3/uL (ref 1.7–7.7)
Neutrophils Relative %: 56 %
Platelets: 199 10*3/uL (ref 150–400)
RBC: 4.11 MIL/uL — ABNORMAL LOW (ref 4.22–5.81)
RDW: 14.1 % (ref 11.5–15.5)
WBC: 7.5 10*3/uL (ref 4.0–10.5)
nRBC: 0 % (ref 0.0–0.2)

## 2022-03-27 MED ORDER — K PHOS MONO-SOD PHOS DI & MONO 155-852-130 MG PO TABS
500.0000 mg | ORAL_TABLET | Freq: Once | ORAL | Status: AC
  Administered 2022-03-27: 500 mg via ORAL
  Filled 2022-03-27: qty 2

## 2022-03-27 MED ORDER — METOPROLOL SUCCINATE ER 50 MG PO TB24
100.0000 mg | ORAL_TABLET | Freq: Every day | ORAL | Status: DC
  Administered 2022-03-28: 100 mg via ORAL
  Filled 2022-03-27: qty 2

## 2022-03-27 MED ORDER — ALUM & MAG HYDROXIDE-SIMETH 200-200-20 MG/5ML PO SUSP
30.0000 mL | ORAL | Status: DC | PRN
  Filled 2022-03-27: qty 30

## 2022-03-27 NOTE — Progress Notes (Signed)
PROGRESS NOTE  Frederick Cisneros.  DOB: 07/20/1941  PCP: Johny Blamer, MD CWC:[id number]  DOA: 03/23/2022  LOS: 4 days  Hospital Day: 5  Brief narrative: Frederick Cerrone. is a 81 y.o. male with PMH significant for DM2, HTN, HLD, CAD, sleep apnea, GERD, BPH, arthritis Patient presented to the ED on 6/5 with complaint of progressively worsening abdominal pain over a 2-week timeframe.  Pain worsened by any attempt to eat or drink.  Also reports subjective fever, generalized myalgia and loose stool. 3 weeks prior, he was diagnosed with colitis as an outpatient and was started on a course of ciprofloxacin and Flagyl which he completed on 03/16/2022 without significant improvement in his symptoms  In the ED, CT abdomen/pelvis showed colitis extending from the mid descending to sigmoid colon without evidence of obstruction perforation or abscess. Patient was admitted to hospitalist service See below for details   Subjective: Patient was seen and examined this morning.  He was in the bathroom.  Significant other in the room. Patient returned back saying that he is still having loose bowel movement with chunks of soft stool.  8-10 episodes last 24 hours.  He was hoping that he would feel better and be able to go home today but understands he is still not able to  Principal Problem:   Colitis Active Problems:   HYPERCHOLESTEROLEMIA   HTN (hypertension)   Severe sepsis (HCC)   AKI (acute kidney injury) (HCC)   Dehydration    Assessment and plan: Sepsis secondary to C. difficile colitis -Presented with abdominal pain, diarrhea, poor oral intake, tachypnea, leukocytosis  -CT abdomen with colitis involving descending and sigmoid colon  -6/6, C. difficile antigen positive, toxin negative but PCR positive. -He has been started on oral vancomycin for tentative course of 2 weeks.  Slowly improving in terms of diarrhea frequency and consistency.  If no significant improvement in next 1 to 2  days, consider Dificid. -Continue regular diet.  No vomiting -To follow-up with GI as an outpatient.  Acute kidney injury -Creatinine 1.63 at presentation with baseline appearing to be approximately 1.4 dating to February 2022.  Creatinine worsened to peak at 1.93 on 6/6.  Gradually improved to normal with IV fluid.  Because of diarrhea, I would continue IV fluid for today. Recent Labs    03/23/22 1741 03/24/22 0436 03/25/22 0451 03/26/22 0600 03/27/22 0747  BUN 20 22 22 13 14   CREATININE 1.63* 1.93* 1.87* 1.60* 1.23   HTN -PTA on Toprol 100 mg daily, amlodipine 5 mg daily, lisinopril 10 mg daily -Currently on low-dose of Toprol at 25 mg daily and amlodipine 5 mg daily.  Blood pressure rising up.  Resume Toprol at full dose of 100 mg daily.  Continue amlodipine.  Lisinopril remains on hold -Continue monitor blood pressure.  HLD -Resume Crestor.   GERD/dyspepsia -Complains of reflux and epigastric pain.  No hematemesis, melena or hematochezia.  Hemoglobin stable.  Has not seen GI in several years.  I started him on Pepcid nightly with some relief. -Follow-up with GI as an outpatient. Recent Labs    03/23/22 1741 03/24/22 0436 03/25/22 0451 03/26/22 0600 03/27/22 0747  HGB 15.7 12.5* 12.6* 12.5* 12.7*  MCV 92.2 92.6 92.5 91.4 92.0   Goals of care   Code Status: Full Code    Mobility: Encourage ambulation  Skin assessment:     Nutritional status:  Body mass index is 26.74 kg/m.          Diet:  Diet Order  Diet regular Room service appropriate? Yes; Fluid consistency: Thin  Diet effective now                   DVT prophylaxis:  SCDs Start: 03/23/22 2049   Antimicrobials: Oral vancomycin Fluid: NS at 50 Consultants: None Family Communication: Significant other at bedside  Status is: Inpatient  Continue in-hospital care because: Diarrhea gradually improving Level of care: Med-Surg   Dispo: The patient is from: Home               Anticipated d/c is to: Hopefully home in 1 to 2 days              Patient currently is not medically stable to d/c.   Difficult to place patient No     Infusions:   sodium chloride 75 mL/hr at 03/27/22 8144    Scheduled Meds:  amLODipine  5 mg Oral Daily   dorzolamide-timolol  1 drop Both Eyes BID   famotidine  20 mg Oral QHS   latanoprost  1 drop Both Eyes QHS   [START ON 03/28/2022] metoprolol succinate  100 mg Oral Daily   rosuvastatin  40 mg Oral Daily   vancomycin  125 mg Oral QID    PRN meds: acetaminophen **OR** [DISCONTINUED] acetaminophen, fentaNYL (SUBLIMAZE) injection, naLOXone (NARCAN)  injection, ondansetron (ZOFRAN) IV   Antimicrobials: Anti-infectives (From admission, onward)    Start     Dose/Rate Route Frequency Ordered Stop   03/25/22 1045  vancomycin (VANCOCIN) capsule 125 mg        125 mg Oral 4 times daily 03/25/22 0958 04/04/22 0959   03/23/22 2200  piperacillin-tazobactam (ZOSYN) IVPB 3.375 g  Status:  Discontinued        3.375 g 12.5 mL/hr over 240 Minutes Intravenous Every 8 hours 03/23/22 2101 03/25/22 0941   03/23/22 2015  ciprofloxacin (CIPRO) IVPB 400 mg  Status:  Discontinued        400 mg 200 mL/hr over 60 Minutes Intravenous Every 12 hours 03/23/22 2001 03/23/22 2046   03/23/22 2015  metroNIDAZOLE (FLAGYL) IVPB 500 mg  Status:  Discontinued        500 mg 100 mL/hr over 60 Minutes Intravenous Every 12 hours 03/23/22 2001 03/23/22 2046       Objective: Vitals:   03/26/22 2118 03/27/22 0453  BP: (!) 160/63 (!) 173/62  Pulse: 71 74  Resp: 16 15  Temp: 98.3 F (36.8 C) 98.1 F (36.7 C)  SpO2: 97% 96%    Intake/Output Summary (Last 24 hours) at 03/27/2022 1104 Last data filed at 03/27/2022 0951 Gross per 24 hour  Intake 834 ml  Output --  Net 834 ml   Filed Weights   03/24/22 1236  Weight: 74 kg   Weight change:  Body mass index is 26.74 kg/m.   Physical Exam: General exam: Pleasant, elderly African-American male.  Not in  physical distress pain Skin: No rashes, lesions or ulcers. HEENT: Atraumatic, normocephalic, no obvious bleeding Lungs: Clear to auscultation bilaterally CVS: Regular rate and rhythm, no murmur GI/Abd soft, improving tenderness and distention, bowel sound present CNS: Alert, awake, oriented x3 Psychiatry: Mood appropriate Extremities: No pedal edema, no calf tenderness  Data Review: I have personally reviewed the laboratory data and studies available.  F/u labs ordered Unresulted Labs (From admission, onward)     Start     Ordered   03/26/22 0500  CBC with Differential/Platelet  Daily,   R     Question:  Specimen  collection method  Answer:  Lab=Lab collect   03/25/22 1124   03/26/22 0500  Basic metabolic panel  Daily,   R     Question:  Specimen collection method  Answer:  Lab=Lab collect   03/25/22 1124            Signed, Frederick GlassBinaya Yosiah Jasmin, MD [facility] 03/27/2022

## 2022-03-27 NOTE — Plan of Care (Signed)

## 2022-03-28 DIAGNOSIS — A0472 Enterocolitis due to Clostridium difficile, not specified as recurrent: Secondary | ICD-10-CM

## 2022-03-28 DIAGNOSIS — N179 Acute kidney failure, unspecified: Secondary | ICD-10-CM | POA: Diagnosis not present

## 2022-03-28 DIAGNOSIS — E876 Hypokalemia: Secondary | ICD-10-CM

## 2022-03-28 DIAGNOSIS — E860 Dehydration: Secondary | ICD-10-CM | POA: Diagnosis not present

## 2022-03-28 LAB — CBC WITH DIFFERENTIAL/PLATELET
Abs Immature Granulocytes: 0.09 10*3/uL — ABNORMAL HIGH (ref 0.00–0.07)
Basophils Absolute: 0.1 10*3/uL (ref 0.0–0.1)
Basophils Relative: 1 %
Eosinophils Absolute: 0.1 10*3/uL (ref 0.0–0.5)
Eosinophils Relative: 2 %
HCT: 37.3 % — ABNORMAL LOW (ref 39.0–52.0)
Hemoglobin: 12.7 g/dL — ABNORMAL LOW (ref 13.0–17.0)
Immature Granulocytes: 1 %
Lymphocytes Relative: 35 %
Lymphs Abs: 2.7 10*3/uL (ref 0.7–4.0)
MCH: 30.8 pg (ref 26.0–34.0)
MCHC: 34 g/dL (ref 30.0–36.0)
MCV: 90.3 fL (ref 80.0–100.0)
Monocytes Absolute: 0.9 10*3/uL (ref 0.1–1.0)
Monocytes Relative: 12 %
Neutro Abs: 3.8 10*3/uL (ref 1.7–7.7)
Neutrophils Relative %: 49 %
Platelet Morphology: NORMAL
Platelets: 214 10*3/uL (ref 150–400)
RBC: 4.13 MIL/uL — ABNORMAL LOW (ref 4.22–5.81)
RDW: 13.8 % (ref 11.5–15.5)
WBC: 7.6 10*3/uL (ref 4.0–10.5)
nRBC: 0 % (ref 0.0–0.2)

## 2022-03-28 LAB — BASIC METABOLIC PANEL
Anion gap: 6 (ref 5–15)
BUN: 13 mg/dL (ref 8–23)
CO2: 24 mmol/L (ref 22–32)
Calcium: 8.8 mg/dL — ABNORMAL LOW (ref 8.9–10.3)
Chloride: 112 mmol/L — ABNORMAL HIGH (ref 98–111)
Creatinine, Ser: 1.25 mg/dL — ABNORMAL HIGH (ref 0.61–1.24)
GFR, Estimated: 58 mL/min — ABNORMAL LOW (ref >60)
Glucose, Bld: 117 mg/dL — ABNORMAL HIGH (ref 70–99)
Potassium: 3.4 mmol/L — ABNORMAL LOW (ref 3.5–5.1)
Sodium: 142 mmol/L (ref 135–145)

## 2022-03-28 MED ORDER — ACIDOPHILUS PROBIOTIC 100 MG PO CAPS: 1.0000 | ORAL_CAPSULE | Freq: Three times a day (TID) | ORAL | 0 refills | Status: AC

## 2022-03-28 MED ORDER — VANCOMYCIN HCL 125 MG PO CAPS: 125.0000 mg | ORAL_CAPSULE | Freq: Four times a day (QID) | ORAL | 0 refills | Status: DC

## 2022-03-28 MED ORDER — POTASSIUM CHLORIDE CRYS ER 20 MEQ PO TBCR
40.0000 meq | EXTENDED_RELEASE_TABLET | ORAL | Status: AC
  Administered 2022-03-28 (×2): 40 meq via ORAL
  Filled 2022-03-28 (×2): qty 2

## 2022-03-28 MED ORDER — VANCOMYCIN HCL 125 MG PO CAPS: 125.0000 mg | ORAL_CAPSULE | Freq: Four times a day (QID) | ORAL | 0 refills | Status: AC

## 2022-03-28 NOTE — Discharge Summary (Signed)
Physician Discharge Summary  Frederick Cisneros. [id number] DOB: 07-19-1941 DOA: 03/23/2022  PCP: Shirline Frees, MD  Admit date: 03/23/2022 Discharge date: 03/28/2022 Discharging to: home Recommendations for Outpatient Follow-up:  F/u [facility] in 1 wk  Consults:  none Procedures:  none   Discharge Diagnoses:   Principal Problem:   C. difficile colitis Active Problems:   Severe sepsis ([place])   AKI (acute kidney injury) ([place])   HYPERCHOLESTEROLEMIA   HTN (hypertension)   Dehydration   Hypokalemia     Hospital Course: Frederick Cisneros. is a 81 y.o. male with PMH significant for HTN, HLD, CAD, sleep apnea, GERD, BPH & arthritis. He presented to the ED on 6/5 with complaint of progressively worsening abdominal pain over a 2-week timeframe.  Pain worsened by any attempt to eat or drink.  Also reports subjective fever, generalized myalgia and loose stool. 3 weeks prior, he was diagnosed with colitis as an outpatient and was started on a course of ciprofloxacin and Flagyl which he completed on 03/16/2022 without significant improvement in his symptoms   In the ED, CT abdomen/pelvis showed colitis extending from the mid descending to sigmoid colon without evidence of obstruction perforation or abscess. Patient was admitted to hospitalist service See below for details  Principal Problem:   C. difficile colitis- severe sepsis with end organ damage (AKI) - slowly improved with Vancomycin - will give total of 10 days  Active Problems:     AKI (acute kidney injury) ([place]) - due to dehydration - baseline Cr 1.4- Creatine rose ot 1.93 in the hospital - improved with IVF      Body mass index is 26.74 kg/m. Nutrition Status:          Discharge Instructions  Discharge Instructions     Diet - low sodium heart healthy   Complete by: As directed    Increase activity slowly   Complete by: As directed       Allergies as of 03/28/2022   No Known Allergies      Medication  List     TAKE these medications    Acidophilus Probiotic 100 MG Caps Take 1 capsule (100 mg total) by mouth every 8 (eight) hours for 21 days.   amLODipine 5 MG tablet Commonly known as: NORVASC Take 5 mg by mouth daily.   aspirin 325 MG tablet Take 325 mg by mouth daily.   colchicine 0.6 MG tablet Take 0.6 mg by mouth 2 (two) times daily as needed (gout).   dorzolamide-timolol 22.3-6.8 MG/ML ophthalmic solution Commonly known as: COSOPT Place 1 drop into both eyes 2 (two) times daily.   ketoconazole 2 % cream Commonly known as: NIZORAL Apply 1 application topically daily as needed for irritation (itching). Reported on 11/05/2015   lisinopril 10 MG tablet Commonly known as: ZESTRIL Take 10 mg by mouth daily.   metoprolol succinate 100 MG 24 hr tablet Commonly known as: TOPROL-XL Take 100 mg by mouth daily. Take with or immediately following a meal.   multivitamin with minerals Tabs tablet Take 1 tablet by mouth daily.   rosuvastatin 40 MG tablet Commonly known as: CRESTOR Take 40 mg by mouth daily. Reported on 11/05/2015   Travoprost (BAK Free) 0.004 % Soln ophthalmic solution Commonly known as: TRAVATAN Place 1 drop into both eyes at bedtime.   vancomycin 125 MG capsule Commonly known as: VANCOCIN Take 1 capsule (125 mg total) by mouth 4 (four) times daily for 8 days.  The results of significant diagnostics from this hospitalization (including imaging, microbiology, ancillary and laboratory) are listed below for reference.    CT Abdomen Pelvis W Contrast  Result Date: 03/23/2022 CLINICAL DATA:  Acute abdominal pain. Diarrhea. Recent antibiotics for diverticulitis. EXAM: CT ABDOMEN AND PELVIS WITH CONTRAST TECHNIQUE: Multidetector CT imaging of the abdomen and pelvis was performed using the standard protocol following bolus administration of intravenous contrast. RADIATION DOSE REDUCTION: This exam was performed according to the departmental  dose-optimization program which includes automated exposure control, adjustment of the mA and/or kV according to patient size and/or use of iterative reconstruction technique. CONTRAST:  47mL OMNIPAQUE IOHEXOL 300 MG/ML  SOLN COMPARISON:  Noncontrast CT 11/24/2020 FINDINGS: Lower chest: No focal airspace disease or pleural effusion. Hepatobiliary: No focal liver abnormality is seen. No gallstones, gallbladder wall thickening, or biliary dilatation. Pancreas: No ductal dilatation or inflammation. Spleen: Normal in size without focal abnormality. Adrenals/Urinary Tract: Normal adrenal glands. There is minimal left perinephric edema. Suggestion of mild proximal left ureteral enhancement in soft tissue thickening. Punctate nonobstructing stone in the mid left kidney. No ureteral calculi. Right kidney is inferiorly located, but without acute findings. The urinary bladder is minimally distended, the right aspect of the bladder extends into a right inguinal hernia. No bladder wall thickening. Stomach/Bowel: There is colonic wall thickening extending from the mid descending through the sigmoid colon with mild pericolonic edema. There are multiple diverticula in the region of colonic wall thickening, but no definite discretely inflamed diverticulum. The appendix is normal. There is no small bowel obstruction or small bowel inflammation. Unremarkable appearance of the stomach. Vascular/Lymphatic: Advanced aortic and branch atherosclerosis. Dense calcification of the iliac arteries. No acute vascular findings. Patent portal vein. No abdominopelvic adenopathy. Reproductive: Enlarged prostate gland. Other: Right inguinal hernia contains fat and right aspect of the bladder dome. There is a small fat containing left inguinal hernia. No abdominopelvic ascites or free air. No bowel perforation or focal fluid collection. Musculoskeletal: There are no acute or suspicious osseous abnormalities. IMPRESSION: 1. Colitis extending from the  mid descending through the sigmoid colon, likely infectious or inflammatory. There are multiple diverticula in the region of colonic wall thickening, but no definite discretely inflamed diverticulum. 2. There is mild left perinephric edema with slight enhancement and thickening of the left renal pelvis, query pyelonephritis. 3. Punctate nonobstructing stone in the mid left kidney. 4. Right inguinal hernia contains fat and right aspect of the bladder dome. Small fat containing left inguinal hernia. 5. Enlarged prostate gland. Aortic Atherosclerosis (ICD10-I70.0). Electronically Signed   By: Keith Rake M.D.   On: 03/23/2022 19:11   Labs:   Basic Metabolic Panel: Recent Labs  Lab 03/23/22 1741 03/24/22 0436 03/25/22 0451 03/26/22 0600 03/27/22 0747 03/28/22 0610  NA 140 138 139 141 141 142  K 4.7 4.0 3.7 3.8 3.5 3.4*  CL 108 109 111 112* 113* 112*  CO2 24 21* 21* 23 22 24   GLUCOSE 149* 145* 125* 119* 113* 117*  BUN 20 22 22 13 14 13   CREATININE 1.63* 1.93* 1.87* 1.60* 1.23 1.25*  CALCIUM 9.7 8.4* 8.6* 8.7* 8.8* 8.8*  MG 2.3 1.7 2.0 2.1  --   --   PHOS  --   --  2.4* 2.0*  --   --      CBC: Recent Labs  Lab 03/23/22 1741 03/24/22 0436 03/25/22 0451 03/26/22 0600 03/27/22 0747 03/28/22 0610  WBC 11.4* 9.2 12.4* 11.6* 7.5 7.6  NEUTROABS 9.6* 6.7  --  8.4* 4.3 3.8  HGB 15.7 12.5* 12.6* 12.5* 12.7* 12.7*  HCT 47.1 37.8* 38.0* 37.1* 37.8* 37.3*  MCV 92.2 92.6 92.5 91.4 92.0 90.3  PLT 190 161 151 165 199 214         SIGNED:   Debbe Odea, MD  [facility] 03/28/2022, 5:36 PM

## 2022-03-28 NOTE — Progress Notes (Signed)
Pt discharged home. AVS printed and educational teaching completed with teach back method. IV removed. VSS. Pt completed al oral doses of potassium. Pt has all belongings. No further questions at this time.

## 2022-04-01 DIAGNOSIS — G4733 Obstructive sleep apnea (adult) (pediatric): Secondary | ICD-10-CM | POA: Diagnosis not present

## 2022-04-07 DIAGNOSIS — Z8719 Personal history of other diseases of the digestive system: Secondary | ICD-10-CM | POA: Diagnosis not present

## 2022-04-07 DIAGNOSIS — D649 Anemia, unspecified: Secondary | ICD-10-CM | POA: Diagnosis not present

## 2022-04-07 DIAGNOSIS — R944 Abnormal results of kidney function studies: Secondary | ICD-10-CM | POA: Diagnosis not present

## 2022-04-07 DIAGNOSIS — R5381 Other malaise: Secondary | ICD-10-CM | POA: Diagnosis not present

## 2022-04-10 DIAGNOSIS — H4423 Degenerative myopia, bilateral: Secondary | ICD-10-CM | POA: Diagnosis not present

## 2022-04-10 DIAGNOSIS — H401133 Primary open-angle glaucoma, bilateral, severe stage: Secondary | ICD-10-CM | POA: Diagnosis not present

## 2022-04-10 DIAGNOSIS — H2513 Age-related nuclear cataract, bilateral: Secondary | ICD-10-CM | POA: Diagnosis not present

## 2022-04-12 ENCOUNTER — Other Ambulatory Visit: Payer: Self-pay

## 2022-04-12 ENCOUNTER — Emergency Department (HOSPITAL_COMMUNITY)
Admission: EM | Admit: 2022-04-12 | Discharge: 2022-04-12 | Disposition: A | Discharge status: Home / Self Care | Payer: Medicare Other | Attending: Emergency Medicine | Admitting: Emergency Medicine

## 2022-04-12 ENCOUNTER — Encounter (HOSPITAL_COMMUNITY): Payer: Self-pay | Admitting: Emergency Medicine

## 2022-04-12 ENCOUNTER — Emergency Department (HOSPITAL_COMMUNITY): Payer: Medicare Other

## 2022-04-12 DIAGNOSIS — I2510 Atherosclerotic heart disease of native coronary artery without angina pectoris: Secondary | ICD-10-CM | POA: Insufficient documentation

## 2022-04-12 DIAGNOSIS — Z87891 Personal history of nicotine dependence: Secondary | ICD-10-CM | POA: Insufficient documentation

## 2022-04-12 DIAGNOSIS — E119 Type 2 diabetes mellitus without complications: Secondary | ICD-10-CM | POA: Diagnosis not present

## 2022-04-12 DIAGNOSIS — Z7982 Long term (current) use of aspirin: Secondary | ICD-10-CM | POA: Diagnosis not present

## 2022-04-12 DIAGNOSIS — R109 Unspecified abdominal pain: Secondary | ICD-10-CM | POA: Diagnosis not present

## 2022-04-12 DIAGNOSIS — Z79899 Other long term (current) drug therapy: Secondary | ICD-10-CM | POA: Insufficient documentation

## 2022-04-12 DIAGNOSIS — A0472 Enterocolitis due to Clostridium difficile, not specified as recurrent: Secondary | ICD-10-CM | POA: Diagnosis not present

## 2022-04-12 DIAGNOSIS — R197 Diarrhea, unspecified: Secondary | ICD-10-CM | POA: Diagnosis present

## 2022-04-12 DIAGNOSIS — R1084 Generalized abdominal pain: Secondary | ICD-10-CM | POA: Diagnosis not present

## 2022-04-12 DIAGNOSIS — I700 Atherosclerosis of aorta: Secondary | ICD-10-CM | POA: Diagnosis not present

## 2022-04-12 DIAGNOSIS — I10 Essential (primary) hypertension: Secondary | ICD-10-CM | POA: Insufficient documentation

## 2022-04-12 LAB — URINALYSIS, ROUTINE W REFLEX MICROSCOPIC
Bilirubin Urine: NEGATIVE
Glucose, UA: NEGATIVE mg/dL
Hgb urine dipstick: NEGATIVE
Ketones, ur: NEGATIVE mg/dL
Leukocytes,Ua: NEGATIVE
Nitrite: NEGATIVE
Protein, ur: NEGATIVE mg/dL
Specific Gravity, Urine: 1.008 (ref 1.005–1.030)
pH: 5 (ref 5.0–8.0)

## 2022-04-12 LAB — LIPASE, BLOOD: Lipase: 39 U/L (ref 11–51)

## 2022-04-12 LAB — CBC
HCT: 41.4 % (ref 39.0–52.0)
Hemoglobin: 13.8 g/dL (ref 13.0–17.0)
MCH: 30.7 pg (ref 26.0–34.0)
MCHC: 33.3 g/dL (ref 30.0–36.0)
MCV: 92 fL (ref 80.0–100.0)
Platelets: 216 10*3/uL (ref 150–400)
RBC: 4.5 MIL/uL (ref 4.22–5.81)
RDW: 13.9 % (ref 11.5–15.5)
WBC: 11.5 10*3/uL — ABNORMAL HIGH (ref 4.0–10.5)
nRBC: 0 % (ref 0.0–0.2)

## 2022-04-12 LAB — COMPREHENSIVE METABOLIC PANEL
ALT: 16 U/L (ref 0–44)
AST: 20 U/L (ref 15–41)
Albumin: 3.9 g/dL (ref 3.5–5.0)
Alkaline Phosphatase: 60 U/L (ref 38–126)
Anion gap: 9 (ref 5–15)
BUN: 23 mg/dL (ref 8–23)
CO2: 21 mmol/L — ABNORMAL LOW (ref 22–32)
Calcium: 9.7 mg/dL (ref 8.9–10.3)
Chloride: 106 mmol/L (ref 98–111)
Creatinine, Ser: 1.67 mg/dL — ABNORMAL HIGH (ref 0.61–1.24)
GFR, Estimated: 41 mL/min — ABNORMAL LOW (ref >60)
Glucose, Bld: 176 mg/dL — ABNORMAL HIGH (ref 70–99)
Potassium: 4 mmol/L (ref 3.5–5.1)
Sodium: 136 mmol/L (ref 135–145)
Total Bilirubin: 0.8 mg/dL (ref 0.3–1.2)
Total Protein: 7.6 g/dL (ref 6.5–8.1)

## 2022-04-12 MED ORDER — IOHEXOL 300 MG/ML  SOLN
80.0000 mL | Freq: Once | INTRAMUSCULAR | Status: AC | PRN
Start: 2022-04-12 — End: 2022-04-12
  Administered 2022-04-12: 80 mL via INTRAVENOUS

## 2022-04-12 MED ORDER — SODIUM CHLORIDE 0.9 % IV BOLUS
1000.0000 mL | Freq: Once | INTRAVENOUS | Status: AC
  Administered 2022-04-12: 1000 mL via INTRAVENOUS

## 2022-04-12 MED ORDER — FIDAXOMICIN 200 MG PO TABS: 200.0000 mg | ORAL_TABLET | Freq: Two times a day (BID) | ORAL | 0 refills | Status: AC

## 2022-04-12 MED ORDER — FIDAXOMICIN 200 MG PO TABS
200.0000 mg | ORAL_TABLET | Freq: Once | ORAL | Status: AC
  Administered 2022-04-12: 200 mg via ORAL
  Filled 2022-04-12: qty 1

## 2022-04-12 NOTE — ED Provider Notes (Signed)
[facility]-EMERGENCY DEPT Provider Note   CSN: [id number] Arrival date & time: 04/12/22  1258     History  Chief Complaint  Patient presents with   Diarrhea   Abdominal Pain    Frederick Cisneros. is a 81 y.o. male.  Patient as above with significant medical history as below, including C. difficile colitis, CAD hypertension, LVH who presents to the ED with complaint of diarrhea, abdominal pain.  Recent admitted 6/5 secondary to C. difficile colitis, discharged 6/10 with oral vancomycin.  Completed oral antibiotics.  Patient began having abdominal cramping, chills, profuse diarrhea yesterday afternoon.  Initially stool was mushy and now entirely liquid.  Approximately 10-12 bowel movements this morning.  Consistent with prior C. difficile infection.  Poor p.o. intake last 24 hours secondary to abdominal cramping and nausea.  No chest pain or dyspnea, no rashes.  No change to urination.  No recent diet changes.  No sick contacts.     Past Medical History:  Diagnosis Date   Arthritis    BPH (benign prostatic hyperplasia)    CAD (coronary artery disease)    Diverticulitis    Dyslipidemia    Esophageal reflux    Glaucoma    per pt   Hiatal hernia    Hypertension    LVH (left ventricular hypertrophy)    Sleep apnea    Type II or unspecified type diabetes mellitus without mention of complication, not stated as uncontrolled     Past Surgical History:  Procedure Laterality Date   CARDIAC CATHETERIZATION     CAROTID ENDARTERECTOMY     COLONOSCOPY     IR GENERIC HISTORICAL  04/29/2016   IR RADIOLOGIST EVAL & MGMT 04/29/2016 [name], DO [facility] INTERV RAD   IR RADIOLOGIST EVAL & MGMT  12/22/2016   IR RADIOLOGIST EVAL & MGMT  12/23/2017   IR RADIOLOGIST EVAL & MGMT  12/14/2018   IR RADIOLOGIST EVAL & MGMT  12/28/2019   IR RADIOLOGIST EVAL & MGMT  01/15/2021   IR RADIOLOGIST EVAL & MGMT  12/29/2021   ROTATOR CUFF REPAIR     right   STOMACH SURGERY      TRANSURETHRAL RESECTION OF PROSTATE       The history is provided by the patient and a friend. No language interpreter was used.  Diarrhea Associated symptoms: abdominal pain and chills   Associated symptoms: no fever, no headaches and no vomiting   Abdominal Pain Associated symptoms: chills, diarrhea and fatigue   Associated symptoms: no chest pain, no cough, no fever, no hematuria, no nausea, no shortness of breath and no vomiting        Home Medications Prior to Admission medications   Medication Sig Start Date End Date Taking? Authorizing Provider  fidaxomicin (DIFICID) 200 MG TABS tablet Take 1 tablet (200 mg total) by mouth 2 (two) times daily for 10 days. 04/12/22 04/22/22 Yes [name], DO  amLODipine (NORVASC) 5 MG tablet Take 5 mg by mouth daily.     [provider]  aspirin 325 MG tablet Take 325 mg by mouth daily.    [provider]  colchicine 0.6 MG tablet Take 0.6 mg by mouth 2 (two) times daily as needed (gout). 09/29/19   [provider]  dorzolamide-timolol (COSOPT) 22.3-6.8 MG/ML ophthalmic solution Place 1 drop into both eyes 2 (two) times daily. 08/22/19   [provider]  ketoconazole (NIZORAL) 2 % cream Apply 1 application topically daily as needed for irritation (itching). Reported  on 11/05/2015 08/08/13   [provider]  Lactobacillus (ACIDOPHILUS PROBIOTIC) 100 MG CAPS Take 1 capsule (100 mg total) by mouth every 8 (eight) hours for 21 days. 03/28/22 04/18/22  [name], MD  lisinopril (PRINIVIL,ZESTRIL) 10 MG tablet Take 10 mg by mouth daily.  08/29/13   [provider]  metoprolol succinate (TOPROL-XL) 100 MG 24 hr tablet Take 100 mg by mouth daily. Take with or immediately following a meal.    [provider]  Multiple Vitamin (MULTIVITAMIN WITH MINERALS) TABS tablet Take 1 tablet by mouth daily.    [provider]  rosuvastatin (CRESTOR) 40 MG tablet Take 40 mg by mouth daily. Reported  on 11/05/2015    [provider]  Travoprost, BAK Free, (TRAVATAN) 0.004 % SOLN ophthalmic solution Place 1 drop into both eyes at bedtime. 08/22/19   [provider]      Allergies    Patient has no known allergies.    Review of Systems   Review of Systems  Constitutional:  Positive for chills and fatigue. Negative for fever.  HENT:  Negative for facial swelling and trouble swallowing.   Eyes:  Negative for photophobia and visual disturbance.  Respiratory:  Negative for cough and shortness of breath.   Cardiovascular:  Negative for chest pain and palpitations.  Gastrointestinal:  Positive for abdominal pain and diarrhea. Negative for nausea and vomiting.  Endocrine: Negative for polydipsia and polyuria.  Genitourinary:  Negative for difficulty urinating and hematuria.  Musculoskeletal:  Negative for gait problem and joint swelling.  Skin:  Negative for pallor and rash.  Neurological:  Negative for syncope and headaches.  Psychiatric/Behavioral:  Negative for agitation and confusion.     Physical Exam Updated Vital Signs BP (!) 150/65   Pulse 71   Temp 97.9 F (36.6 C) (Oral)   Resp 15   Ht 5' 5.5" (1.664 m)   Wt 76.2 kg   SpO2 94%   BMI 27.53 kg/m  Physical Exam Vitals and nursing note reviewed.  Constitutional:      General: He is not in acute distress.    Appearance: He is well-developed. He is not ill-appearing.  HENT:     Head: Normocephalic and atraumatic.     Right Ear: External ear normal.     Left Ear: External ear normal.     Mouth/Throat:     Mouth: Mucous membranes are moist.  Eyes:     General: No scleral icterus. Cardiovascular:     Rate and Rhythm: Normal rate and regular rhythm.     Pulses: Normal pulses.     Heart sounds: Normal heart sounds.  Pulmonary:     Effort: Pulmonary effort is normal. No respiratory distress.     Breath sounds: Normal breath sounds.  Abdominal:     General: Abdomen is flat.     Palpations: Abdomen is  soft.     Tenderness: There is generalized abdominal tenderness. There is no guarding or rebound.  Musculoskeletal:        General: Normal range of motion.     Cervical back: Normal range of motion.     Right lower leg: No edema.     Left lower leg: No edema.  Skin:    General: Skin is warm and dry.     Capillary Refill: Capillary refill takes less than 2 seconds.  Neurological:     Mental Status: He is alert and oriented to person, place, and time.  Psychiatric:  Mood and Affect: Mood normal.        Behavior: Behavior normal.     ED Results / Procedures / Treatments   Labs (all labs ordered are listed, but only abnormal results are displayed) Labs Reviewed  COMPREHENSIVE METABOLIC PANEL - Abnormal; Notable for the following components:      Result Value   CO2 21 (*)    Glucose, Bld 176 (*)    Creatinine, Ser 1.67 (*)    GFR, Estimated 41 (*)    All other components within normal limits  CBC - Abnormal; Notable for the following components:   WBC 11.5 (*)    All other components within normal limits  LIPASE, BLOOD  URINALYSIS, ROUTINE W REFLEX MICROSCOPIC    EKG None  Radiology CT ABDOMEN PELVIS W CONTRAST  Result Date: 04/12/2022 CLINICAL DATA:  Abdominal pain, diarrhea, abdominal pain since Saturday, recently hospitalized for C diff EXAM: CT ABDOMEN AND PELVIS WITH CONTRAST TECHNIQUE: Multidetector CT imaging of the abdomen and pelvis was performed using the standard protocol following bolus administration of intravenous contrast. RADIATION DOSE REDUCTION: This exam was performed according to the departmental dose-optimization program which includes automated exposure control, adjustment of the mA and/or kV according to patient size and/or use of iterative reconstruction technique. CONTRAST:  80mL OMNIPAQUE IOHEXOL 300 MG/ML  SOLN COMPARISON:  03/23/2022 FINDINGS: Lower chest: No acute abnormality.  Coronary artery calcifications. Hepatobiliary: No solid liver  abnormality is seen. No gallstones, gallbladder wall thickening, or biliary dilatation. Pancreas: Unremarkable. No pancreatic ductal dilatation or surrounding inflammatory changes. Spleen: Normal in size without significant abnormality. Adrenals/Urinary Tract: Adrenal glands are unremarkable. Small nonobstructive calculus in the lateral midportion of the left kidney (series 5, image 53). No right-sided calculi, ureteral calculi, or hydronephrosis. Bladder is unremarkable. Stomach/Bowel: Stomach is within normal limits. Appendix appears normal. No evidence of bowel wall thickening, distention, or inflammatory changes. Descending and sigmoid diverticulosis, with similar long segment wall thickening involving the distal descending colon through the rectum (series 2, image 63). Vascular/Lymphatic: Severe aortic atherosclerosis. No enlarged abdominal or pelvic lymph nodes. Reproductive: Mild prostatomegaly. Other: Small right inguinal hernia, containing fat and a small portion of the bladder dome (series 2, image 75). No ascites. Musculoskeletal: No acute or significant osseous findings. IMPRESSION: 1. Descending and sigmoid diverticulosis, with similar long segment wall thickening involving the distal descending colon through the rectum. Although the appearance is generally most suggestive of diverticulitis, differential considerations are nonspecific and include infectious colitis given reported recent history of C difficile colitis. 2. Mild prostatomegaly. 3. Nonobstructive left nephrolithiasis. 4. Coronary artery disease. Aortic Atherosclerosis (ICD10-I70.0). Electronically Signed   By: Jearld Lesch M.D.   On: 04/12/2022 15:36    Procedures Procedures    Medications Ordered in ED Medications  fidaxomicin (DIFICID) tablet 200 mg (has no administration in time range)  sodium chloride 0.9 % bolus 1,000 mL (0 mLs Intravenous Stopped 04/12/22 1645)  iohexol (OMNIPAQUE) 300 MG/ML solution 80 mL (80 mLs  Intravenous Contrast Given 04/12/22 1514)    ED Course/ Medical Decision Making/ A&P                           Medical Decision Making Amount and/or Complexity of Data Reviewed Labs: ordered. Radiology: ordered.  Risk Prescription drug management.    CC: Abdominal pain, diarrhea  This patient presents to the Emergency Department for the above complaint. This involves an extensive number of treatment options and is a complaint  that carries with it a high risk of complications and morbidity. Vital signs were reviewed. Serious etiologies considered.  Differential diagnosis includes but is not exclusive to acute appendicitis, renal colic, testicular torsion, urinary tract infection, prostatitis,  diverticulitis, small bowel obstruction, colitis, abdominal aortic aneurysm, gastroenteritis, constipation etc.  Record review:  Previous records obtained and reviewed  Recent hospitalization, prior office visits, prior labs and imaging  Additional history obtained from friend at bedside  Medical and surgical history as noted above.   Work up as above, notable for:  Labs & imaging results that were available during my care of the patient were visualized by me and considered in my medical decision making.   I ordered imaging studies which included CT abdomen pelvis IV contrast. I visualized the imaging, interpreted images, and I agree with radiologist interpretation.  Imaging concerning for infectious colitis versus diverticulitis.  Given recent diagnosis of C. difficile colitis in the last month favor recurrence of C. difficile colitis.   Cardiac monitoring reviewed and interpreted personally which shows NSR  Labs reviewed, creatinine is similar to his baseline.  There is mild leukocytosis.  Does not appear to be septic.  Labs are reassuring.  Management: IV fluids  ED Course:     Reassessment:  Symptoms have improved.  No fever.  No nausea or vomiting.  Patient is not  septic.  Admission was considered.    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  We will start patient on fidaxomicin given this is his first recurrence.  Give first dose in the ER.  Have patient follow-up infectious disease in outpatient.  Encourage oral rehydration.  Patient tolerant p.o. intake without difficulty.  Discussed return precautions.  The patient improved significantly and was discharged in stable condition. Detailed discussions were had with the patient regarding current findings, and need for close f/u with PCP or on call doctor. The patient has been instructed to return immediately if the symptoms worsen in any way for re-evaluation. Patient verbalized understanding and is in agreement with current care plan. All questions answered prior to discharge.              Social determinants of health include -  Social History   Socioeconomic History   Marital status: Married    Spouse name: Not on file   Number of children: 4   Years of education: Not on file   Highest education level: Not on file  Occupational History   Occupation: retired  Tobacco Use   Smoking status: Former    Types: Cigarettes    Quit date: 10/19/1988    Years since quitting: 33.5   Smokeless tobacco: Never  Vaping Use   Vaping Use: Never used  Substance and Sexual Activity   Alcohol use: Yes    Comment: occasional wine   Drug use: No   Sexual activity: Not on file  Other Topics Concern   Not on file  Social History Narrative   Not on file   Social Determinants of Health   Financial Resource Strain: Not on file  Food Insecurity: Not on file  Transportation Needs: Not on file  Physical Activity: Not on file  Stress: Not on file  Social Connections: Not on file  Intimate Partner Violence: Not on file      This chart was dictated using voice recognition software.  Despite best efforts to proofread,  errors can occur which can change the documentation  meaning.  Final Clinical Impression(s) / ED Diagnoses Final diagnoses:  C. difficile colitis  Diarrhea, unspecified type    Rx / DC Orders ED Discharge Orders          Ordered    Ambulatory referral to Infectious Disease       Comments: Recurrent cdiff   04/12/22 1641    fidaxomicin (DIFICID) 200 MG TABS tablet  2 times daily        04/12/22 1643              Sloan Leiter, DO 04/12/22 1647

## 2022-04-13 ENCOUNTER — Telehealth (HOSPITAL_COMMUNITY): Payer: Self-pay | Admitting: Emergency Medicine

## 2022-04-13 MED ORDER — VANCOMYCIN HCL 125 MG PO CAPS: 125.0000 mg | ORAL_CAPSULE | Freq: Four times a day (QID) | ORAL | 0 refills | Status: DC

## 2022-04-16 DIAGNOSIS — H903 Sensorineural hearing loss, bilateral: Secondary | ICD-10-CM | POA: Diagnosis not present

## 2022-04-16 DIAGNOSIS — G4733 Obstructive sleep apnea (adult) (pediatric): Secondary | ICD-10-CM | POA: Diagnosis not present

## 2022-04-16 DIAGNOSIS — H9312 Tinnitus, left ear: Secondary | ICD-10-CM | POA: Diagnosis not present

## 2022-04-24 DIAGNOSIS — R944 Abnormal results of kidney function studies: Secondary | ICD-10-CM | POA: Diagnosis not present

## 2022-05-07 ENCOUNTER — Ambulatory Visit: Payer: Medicare Other | Admitting: Internal Medicine

## 2022-05-08 ENCOUNTER — Other Ambulatory Visit: Payer: Self-pay

## 2022-05-08 ENCOUNTER — Encounter (HOSPITAL_COMMUNITY): Payer: Self-pay

## 2022-05-08 ENCOUNTER — Emergency Department (HOSPITAL_COMMUNITY): Payer: Medicare Other

## 2022-05-08 ENCOUNTER — Emergency Department (HOSPITAL_COMMUNITY)
Admission: EM | Admit: 2022-05-08 | Discharge: 2022-05-08 | Disposition: A | Discharge status: Home / Self Care | Payer: Medicare Other | Attending: Emergency Medicine | Admitting: Emergency Medicine

## 2022-05-08 DIAGNOSIS — R5383 Other fatigue: Secondary | ICD-10-CM | POA: Diagnosis not present

## 2022-05-08 DIAGNOSIS — R109 Unspecified abdominal pain: Secondary | ICD-10-CM | POA: Diagnosis not present

## 2022-05-08 DIAGNOSIS — K529 Noninfective gastroenteritis and colitis, unspecified: Secondary | ICD-10-CM

## 2022-05-08 DIAGNOSIS — Z7982 Long term (current) use of aspirin: Secondary | ICD-10-CM | POA: Diagnosis not present

## 2022-05-08 DIAGNOSIS — R197 Diarrhea, unspecified: Secondary | ICD-10-CM | POA: Diagnosis present

## 2022-05-08 DIAGNOSIS — I700 Atherosclerosis of aorta: Secondary | ICD-10-CM | POA: Diagnosis not present

## 2022-05-08 DIAGNOSIS — R9431 Abnormal electrocardiogram [ECG] [EKG]: Secondary | ICD-10-CM | POA: Diagnosis not present

## 2022-05-08 HISTORY — DX: Enterocolitis due to Clostridium difficile, not specified as recurrent: A04.72

## 2022-05-08 LAB — URINALYSIS, ROUTINE W REFLEX MICROSCOPIC
Bilirubin Urine: NEGATIVE
Glucose, UA: NEGATIVE mg/dL
Hgb urine dipstick: NEGATIVE
Ketones, ur: 5 mg/dL — AB
Leukocytes,Ua: NEGATIVE
Nitrite: NEGATIVE
Protein, ur: 30 mg/dL — AB
Specific Gravity, Urine: 1.023 (ref 1.005–1.030)
pH: 5 (ref 5.0–8.0)

## 2022-05-08 LAB — COMPREHENSIVE METABOLIC PANEL
ALT: 21 U/L (ref 0–44)
AST: 26 U/L (ref 15–41)
Albumin: 3.7 g/dL (ref 3.5–5.0)
Alkaline Phosphatase: 63 U/L (ref 38–126)
Anion gap: 8 (ref 5–15)
BUN: 21 mg/dL (ref 8–23)
CO2: 23 mmol/L (ref 22–32)
Calcium: 9.3 mg/dL (ref 8.9–10.3)
Chloride: 105 mmol/L (ref 98–111)
Creatinine, Ser: 1.78 mg/dL — ABNORMAL HIGH (ref 0.61–1.24)
GFR, Estimated: 38 mL/min — ABNORMAL LOW (ref >60)
Glucose, Bld: 115 mg/dL — ABNORMAL HIGH (ref 70–99)
Potassium: 3.8 mmol/L (ref 3.5–5.1)
Sodium: 136 mmol/L (ref 135–145)
Total Bilirubin: 0.2 mg/dL — ABNORMAL LOW (ref 0.3–1.2)
Total Protein: 7.7 g/dL (ref 6.5–8.1)

## 2022-05-08 LAB — CBC WITH DIFFERENTIAL/PLATELET
Abs Immature Granulocytes: 0.03 K/uL (ref 0.00–0.07)
Basophils Absolute: 0 K/uL (ref 0.0–0.1)
Basophils Relative: 1 %
Eosinophils Absolute: 0.2 K/uL (ref 0.0–0.5)
Eosinophils Relative: 2 %
HCT: 39.6 % (ref 39.0–52.0)
Hemoglobin: 13.1 g/dL (ref 13.0–17.0)
Immature Granulocytes: 0 %
Lymphocytes Relative: 26 %
Lymphs Abs: 1.9 K/uL (ref 0.7–4.0)
MCH: 30.3 pg (ref 26.0–34.0)
MCHC: 33.1 g/dL (ref 30.0–36.0)
MCV: 91.5 fL (ref 80.0–100.0)
Monocytes Absolute: 1.3 K/uL — ABNORMAL HIGH (ref 0.1–1.0)
Monocytes Relative: 18 %
Neutro Abs: 3.9 K/uL (ref 1.7–7.7)
Neutrophils Relative %: 53 %
Platelets: 190 K/uL (ref 150–400)
RBC: 4.33 MIL/uL (ref 4.22–5.81)
RDW: 14 % (ref 11.5–15.5)
WBC: 7.3 K/uL (ref 4.0–10.5)
nRBC: 0 % (ref 0.0–0.2)

## 2022-05-08 LAB — LIPASE, BLOOD: Lipase: 42 U/L (ref 11–51)

## 2022-05-08 LAB — C DIFFICILE QUICK SCREEN W PCR REFLEX
C Diff antigen: NEGATIVE
C Diff interpretation: NOT DETECTED
C Diff toxin: NEGATIVE

## 2022-05-08 LAB — MAGNESIUM: Magnesium: 2.3 mg/dL (ref 1.7–2.4)

## 2022-05-08 MED ORDER — IOHEXOL 300 MG/ML  SOLN
100.0000 mL | Freq: Once | INTRAMUSCULAR | Status: AC | PRN
Start: 2022-05-08 — End: 2022-05-08
  Administered 2022-05-08: 80 mL via INTRAVENOUS

## 2022-05-08 NOTE — ED Provider Triage Note (Signed)
Emergency Medicine Provider Triage Evaluation Note  Frederick Cisneros. , a 81 y.o. male  was evaluated in triage.  Pt complains of diarrhea, stomach pain, fatigue, hx of c diff and told come back to the ER to be tested. Admitted x 5 days earlier this year for x c diff.  Review of Systems  Positive: Diarrhea, fatigue, stomach pain  Negative: fever  Physical Exam  BP (!) 123/58 (BP Location: Right Arm)   Pulse 83   Temp 97.8 F (36.6 C) (Oral)   Resp 18   Ht 5' 5.5" (1.664 m)   Wt 76.2 kg   SpO2 99%   BMI 27.53 kg/m  Gen:   Awake, no distress   Resp:  Normal effort  MSK:   Moves extremities without difficulty  Other:    Medical Decision Making  Medically screening exam initiated at 12:58 PM.  Appropriate orders placed.  Debroah Loop. was informed that the remainder of the evaluation will be completed by another provider, this initial triage assessment does not replace that evaluation, and the importance of remaining in the ED until their evaluation is complete.     Jeannie Fend, PA-C 05/08/22 1259

## 2022-05-08 NOTE — ED Triage Notes (Signed)
Patient states he was hospitalized on 03/23/22 with c. Diff. Finished the antibiotics and was given another round of antibiotics for c.diff on 04/12/22. Patient states he finished the the antibiotics  2 weeks ago and is now having abdominal pain, diarrhea, and fatigue.

## 2022-05-08 NOTE — ED Provider Notes (Signed)
[facility]-EMERGENCY DEPT Provider Note   CSN: [id number] Arrival date & time: 05/08/22  1237     History  Chief Complaint  Patient presents with   Abdominal Pain   Fatigue   Diarrhea    Frederick Cisneros. is a 81 y.o. male.  Patient here with ongoing diarrhea, stomach pain, fatigue.  History of C. difficile.  Was admitted for 5 days earlier this year for C. difficile.  Just finished a different course of treatment for C. difficile as well.  Continues to have 12-15 loose stools a day.  States he feels like everything he puts inside comes right out.  He has had some abdominal cramping but denies any fever, chest pain, shortness of breath.  Stool is very liquidy and watery.  The history is provided by the patient.       Home Medications Prior to Admission medications   Medication Sig Start Date End Date Taking? Authorizing Provider  amLODipine (NORVASC) 5 MG tablet Take 5 mg by mouth daily.     [provider]  aspirin 325 MG tablet Take 325 mg by mouth daily.    [provider]  colchicine 0.6 MG tablet Take 0.6 mg by mouth 2 (two) times daily as needed (gout). 09/29/19   [provider]  dorzolamide-timolol (COSOPT) 22.3-6.8 MG/ML ophthalmic solution Place 1 drop into both eyes 2 (two) times daily. 08/22/19   [provider]  ketoconazole (NIZORAL) 2 % cream Apply 1 application topically daily as needed for irritation (itching). Reported on 11/05/2015 08/08/13   [provider]  lisinopril (PRINIVIL,ZESTRIL) 10 MG tablet Take 10 mg by mouth daily.  08/29/13   [provider]  metoprolol succinate (TOPROL-XL) 100 MG 24 hr tablet Take 100 mg by mouth daily. Take with or immediately following a meal.    [provider]  Multiple Vitamin (MULTIVITAMIN WITH MINERALS) TABS tablet Take 1 tablet by mouth daily.    [provider]  rosuvastatin (CRESTOR) 40 MG tablet Take 40 mg by mouth daily.  Reported on 11/05/2015    [provider]  Travoprost, BAK Free, (TRAVATAN) 0.004 % SOLN ophthalmic solution Place 1 drop into both eyes at bedtime. 08/22/19   [provider]  vancomycin (VANCOCIN) 125 MG capsule Take 1 capsule (125 mg total) by mouth 4 (four) times daily. 04/13/22   Terrilee Files, MD      Allergies    Patient has no known allergies.    Review of Systems   Review of Systems  Physical Exam Updated Vital Signs BP (!) 146/60   Pulse 69   Temp 98.1 F (36.7 C) (Oral)   Resp 17   Ht 5' 5.5" (1.664 m)   Wt 76.2 kg   SpO2 97%   BMI 27.53 kg/m  Physical Exam Vitals and nursing note reviewed.  Constitutional:      General: He is not in acute distress.    Appearance: He is well-developed. He is not ill-appearing.  HENT:     Head: Normocephalic and atraumatic.     Mouth/Throat:     Mouth: Mucous membranes are moist.  Eyes:     Extraocular Movements: Extraocular movements intact.     Conjunctiva/sclera: Conjunctivae normal.     Pupils: Pupils are equal, round, and reactive to light.  Cardiovascular:     Rate and Rhythm: Normal rate and regular rhythm.     Heart sounds: Normal heart sounds. No murmur heard. Pulmonary:  Effort: Pulmonary effort is normal. No respiratory distress.     Breath sounds: Normal breath sounds.  Abdominal:     Palpations: Abdomen is soft.     Tenderness: There is abdominal tenderness. There is no guarding.  Musculoskeletal:        General: No swelling.     Cervical back: Neck supple.  Skin:    General: Skin is warm and dry.     Capillary Refill: Capillary refill takes less than 2 seconds.  Neurological:     General: No focal deficit present.     Mental Status: He is alert.  Psychiatric:        Mood and Affect: Mood normal.     ED Results / Procedures / Treatments   Labs (all labs ordered are listed, but only abnormal results are displayed) Labs Reviewed  CBC WITH DIFFERENTIAL/PLATELET - Abnormal;  Notable for the following components:      Result Value   Monocytes Absolute 1.3 (*)    All other components within normal limits  COMPREHENSIVE METABOLIC PANEL - Abnormal; Notable for the following components:   Glucose, Bld 115 (*)    Creatinine, Ser 1.78 (*)    Total Bilirubin 0.2 (*)    GFR, Estimated 38 (*)    All other components within normal limits  URINALYSIS, ROUTINE W REFLEX MICROSCOPIC - Abnormal; Notable for the following components:   Color, Urine AMBER (*)    APPearance HAZY (*)    Ketones, ur 5 (*)    Protein, ur 30 (*)    Bacteria, UA RARE (*)    All other components within normal limits  C DIFFICILE QUICK SCREEN W PCR REFLEX    GASTROINTESTINAL PANEL BY PCR, STOOL (REPLACES STOOL CULTURE)  LIPASE, BLOOD  MAGNESIUM    EKG EKG Interpretation  Date/Time:  Friday May 08 2022 17:08:04 EDT Ventricular Rate:  71 PR Interval:  211 QRS Duration: 105 QT Interval:  427 QTC Calculation: 464 R Axis:   22 Text Interpretation: Sinus rhythm Repol abnrm, global ischemia, diffuse leads Confirmed by Virgina Norfolk ([id number]) on 05/08/2022 5:16:21 PM  Radiology CT ABDOMEN PELVIS W CONTRAST  Result Date: 05/08/2022 CLINICAL DATA:  Abdominal pain, diarrhea, fatigue, history of C difficile EXAM: CT ABDOMEN AND PELVIS WITH CONTRAST TECHNIQUE: Multidetector CT imaging of the abdomen and pelvis was performed using the standard protocol following bolus administration of intravenous contrast. RADIATION DOSE REDUCTION: This exam was performed according to the departmental dose-optimization program which includes automated exposure control, adjustment of the mA and/or kV according to patient size and/or use of iterative reconstruction technique. CONTRAST:  42mL OMNIPAQUE IOHEXOL 300 MG/ML  SOLN COMPARISON:  04/12/2022 FINDINGS: Lower chest: No acute abnormality.  Coronary artery calcifications. Hepatobiliary: No solid liver abnormality is seen. No gallstones, gallbladder wall thickening, or  biliary dilatation. Pancreas: Unremarkable. No pancreatic ductal dilatation or surrounding inflammatory changes. Spleen: Normal in size without significant abnormality. Adrenals/Urinary Tract: Adrenal glands are unremarkable. Kidneys are normal, without renal calculi, solid lesion, or hydronephrosis. Bladder is unremarkable. Stomach/Bowel: Stomach is within normal limits. Appendix appears normal. Descending and sigmoid diverticulosis. Wall thickening and fat stranding about the distal descending and sigmoid, similar in appearance to prior examination (series 2, image 65). Vascular/Lymphatic: Aortic atherosclerosis. No enlarged abdominal or pelvic lymph nodes. Reproductive: Prostatomegaly. Other: Small fat containing right inguinal hernia containing a portion of the urinary bladder (series 2, image 76). No ascites. Musculoskeletal: No acute or significant osseous findings. IMPRESSION: 1. Descending and sigmoid diverticulosis. Wall thickening  and fat stranding about the distal descending and sigmoid, similar in appearance to prior examination. As previously reported, differential considerations include diverticulitis as well as other infectious or inflammatory colitis, to include C diff colitis. 2. Prostatomegaly. 3. Nonobstructive left nephrolithiasis 4. Coronary artery disease. Aortic Atherosclerosis (ICD10-I70.0). Electronically Signed   By: Jearld Lesch M.D.   On: 05/08/2022 17:57    Procedures Procedures    Medications Ordered in ED Medications  iohexol (OMNIPAQUE) 300 MG/ML solution 100 mL (80 mLs Intravenous Contrast Given 05/08/22 1740)    ED Course/ Medical Decision Making/ A&P                           Medical Decision Making Amount and/or Complexity of Data Reviewed Labs: ordered. Radiology: ordered.  Risk Prescription drug management.   Frederick Cisneros. is here with diarrhea.  Here with concern for C. difficile.  This was treated for this recently.  He has had some loose stools  again.  He thinks may be 10-12 a day.  No real major abdominal discomfort.  Cramping at times.  Denies any fevers or chills.  Patient with history of diabetes, CAD.  Overall he states has been able to drink plenty of fluids but he feels like it comes right out.  Differential diagnosis is likely ongoing colitis may be C. difficile may be inflammatory may be diverticulitis.  We will get CBC, CMP, lipase, CT scan abdomen pelvis, C. difficile panel.  C. difficile is negative.  Negative for toxin as well.  Per my review interpretation of labs she has no significant leukocytosis or anemia or electrolyte abnormality.  Creatinine around baseline.  He is feeling better after IV fluids.  CT scan shows some diverticulosis.  There is some wall thickening and fat stranding around the distal descending and sigmoid area similar to prior exam.  At this time I do not think he has an infectious colitis.  I do not think he has C. difficile colitis.  This is may be an inflammatory process or resolving process.  I think I will hold off on any antibiotic and treatment at this time.  He has follow-up with GI in place.  He overall is well-appearing.  He is not having any bloody stools.  He has no abdominal tenderness currently.  Hemodynamically stable.  Recommend continued symptomatic management at home.  He understands return precautions including fever, worsening pain.  Discharged in good condition.  This chart was dictated using voice recognition software.  Despite best efforts to proofread,  errors can occur which can change the documentation meaning.         Final Clinical Impression(s) / ED Diagnoses Final diagnoses:  Colitis    Rx / DC Orders ED Discharge Orders     None         Virgina Norfolk, DO 05/08/22 2010

## 2022-05-08 NOTE — Discharge Instructions (Signed)
Follow-up with your primary care doctor.  If you develop fever, worsening abdominal pain please return for evaluation.

## 2022-05-09 LAB — GASTROINTESTINAL PANEL BY PCR, STOOL (REPLACES STOOL CULTURE)

## 2022-06-04 ENCOUNTER — Ambulatory Visit (INDEPENDENT_AMBULATORY_CARE_PROVIDER_SITE_OTHER): Payer: Medicare Other | Admitting: Internal Medicine

## 2022-06-04 ENCOUNTER — Encounter: Payer: Self-pay | Admitting: Internal Medicine

## 2022-06-04 ENCOUNTER — Other Ambulatory Visit: Payer: Self-pay

## 2022-06-04 DIAGNOSIS — A0472 Enterocolitis due to Clostridium difficile, not specified as recurrent: Secondary | ICD-10-CM | POA: Diagnosis not present

## 2022-06-04 NOTE — Progress Notes (Signed)
[facility]  Reason for Consult: C diff  Referring Provider: Dr  Cullens (EDP)   HPI:    Frederick Cisneros. is a 81 y.o. male with PMHx as below who presents to the clinic for C diff.   Patient was diagnosed with episode of colitis and prescribed cipro/flagyl in May 2023 which he completed without significant improvement.  He then presented to the ED on 03/23/22 where imaging showed colitis.  C diff testing was Antigen positive, Toxin negative, and PCR positive.  He was treated with PO Vancomycin x 10 days with maybe some slow improvement.  He was discharged on 03/28/22.  He then presented to the ED again on 04/12/22 with recurrent diarrhea and abdominal pain.  There was concern for recurrent C diff.  No testing was done and he was prescribed Fidaxomicin x 10 days.  Patient was unable to afford this medication so this was changed to vancomycin again.  Patient seen again in the ED on 7/21 with diarrhea reportedly having 12-15 loose stools per day.  Repeat CT scan showed some diverticulosis and some wall thickening/fat stranding similar to prior exam.  GI pathogen panel and repeat C diff testing was negative.  He was discharged and noted to have GI follow up in place.  He presents today for follow up and reports no fevers.  He has not had diarrhea for 2-3 weeks.     Patient's Medications  New Prescriptions   No medications on file  Previous Medications   AMLODIPINE (NORVASC) 5 MG TABLET    Take 5 mg by mouth daily.    ASPIRIN 325 MG TABLET    Take 325 mg by mouth daily.   COLCHICINE 0.6 MG TABLET    Take 0.6 mg by mouth 2 (two) times daily as needed (gout).   DORZOLAMIDE-TIMOLOL (COSOPT) 22.3-6.8 MG/ML OPHTHALMIC SOLUTION    Place 1 drop into both eyes 2 (two) times daily.   KETOCONAZOLE (NIZORAL) 2 % CREAM    Apply 1 application topically daily as needed for irritation (itching). Reported on 11/05/2015   LISINOPRIL (PRINIVIL,ZESTRIL) 10 MG TABLET    Take 10 mg by mouth  daily.    METOPROLOL SUCCINATE (TOPROL-XL) 100 MG 24 HR TABLET    Take 100 mg by mouth daily. Take with or immediately following a meal.   MULTIPLE VITAMIN (MULTIVITAMIN WITH MINERALS) TABS TABLET    Take 1 tablet by mouth daily.   ROSUVASTATIN (CRESTOR) 40 MG TABLET    Take 40 mg by mouth daily. Reported on 11/05/2015   TRAVOPROST, BAK FREE, (TRAVATAN) 0.004 % SOLN OPHTHALMIC SOLUTION    Place 1 drop into both eyes at bedtime.  Modified Medications   No medications on file  Discontinued Medications   VANCOMYCIN (VANCOCIN) 125 MG CAPSULE    Take 1 capsule (125 mg total) by mouth 4 (four) times daily.      Past Medical History:  Diagnosis Date   Arthritis    BPH (benign prostatic hyperplasia)    C. difficile diarrhea    CAD (coronary artery disease)    Diverticulitis    Dyslipidemia    Esophageal reflux    Glaucoma    per pt   Hiatal hernia    Hypertension    LVH (left ventricular hypertrophy)    Sleep apnea    Type II or unspecified type diabetes mellitus without mention of complication, not stated as uncontrolled     Social History   Tobacco Use  Smoking status: Former    Types: Cigarettes    Quit date: 10/19/1988    Years since quitting: 33.6   Smokeless tobacco: Never  Vaping Use   Vaping Use: Never used  Substance Use Topics   Alcohol use: Yes    Comment: occasional wine   Drug use: No    Family History  Problem Relation Age of Onset   Breast cancer Sister    Diabetes Sister        x 3   GI problems Father        type unknown   Colon cancer Neg Hx    Esophageal cancer Neg Hx    Stomach cancer Neg Hx    Rectal cancer Neg Hx     No Known Allergies  Review of Systems  All other systems reviewed and are negative.  Except as noted above.    OBJECTIVE:    Vitals:   06/04/22 1516  BP: 125/70  Pulse: 64  Temp: 97.6 F (36.4 C)  TempSrc: Oral  SpO2: 96%     There is no height or weight on file to calculate BMI.  Physical Exam Constitutional:       General: He is not in acute distress.    Appearance: Normal appearance.  HENT:     Head: Normocephalic and atraumatic.  Pulmonary:     Effort: Pulmonary effort is normal. No respiratory distress.  Abdominal:     General: There is no distension.     Palpations: Abdomen is soft.  Skin:    General: Skin is warm and dry.  Neurological:     General: No focal deficit present.     Mental Status: He is alert and oriented to person, place, and time.  Psychiatric:        Mood and Affect: Mood normal.        Behavior: Behavior normal.      Labs and Microbiology:     Latest Ref Rng & Units 05/08/2022    2:04 PM 04/12/2022    1:17 PM 03/28/2022    6:10 AM  CBC  WBC 4.0 - 10.5 K/uL 7.3  11.5  7.6   Hemoglobin 13.0 - 17.0 g/dL 13.1  13.8  12.7   Hematocrit 39.0 - 52.0 % 39.6  41.4  37.3   Platelets 150 - 400 K/uL 190  216  214       Latest Ref Rng & Units 05/08/2022    2:04 PM 04/12/2022    1:17 PM 03/28/2022    6:10 AM  CMP  Glucose 70 - 99 mg/dL 115  176  117   BUN 8 - 23 mg/dL 21  23  13    Creatinine 0.61 - 1.24 mg/dL 1.78  1.67  1.25   Sodium 135 - 145 mmol/L 136  136  142   Potassium 3.5 - 5.1 mmol/L 3.8  4.0  3.4   Chloride 98 - 111 mmol/L 105  106  112   CO2 22 - 32 mmol/L 23  21  24    Calcium 8.9 - 10.3 mg/dL 9.3  9.7  8.8   Total Protein 6.5 - 8.1 g/dL 7.7  7.6    Total Bilirubin 0.3 - 1.2 mg/dL 0.2  0.8    Alkaline Phos 38 - 126 U/L 63  60    AST 15 - 41 U/L 26  20    ALT 0 - 44 U/L 21  16       No results found for  this or any previous visit (from the past 240 hour(s)).    ASSESSMENT & PLAN:    C. difficile colitis No ongoing evidence of infectious colitis at this time and repeat C diff and GI panel was negative after 2 courses of treatment for C diff infection.  Unclear if he actually had recurrence after initial treatment and also possible that he could have been dealing with a post-infectious irritable bowel syndrome.  For now, do not recommend further  treatment and can follow up as needed since his symptoms have resolved.  Advised judicious antibiotic use in the future to hopefully prevent recurrence and could consider prophylactic therapy if other systemic antibiotics needed.       Vedia Coffer for Infectious [facility] 06/04/2022, 3:37 PM   I have personally spent 45 minutes involved in face-to-face and non-face-to-face activities for this patient on the day of the visit. Professional time spent includes the following activities: Preparing to see the patient (review of tests), Obtaining and/or reviewing separately obtained history (admission/discharge record), Performing a medically appropriate examination and/or evaluation , Ordering medications/tests/procedures, referring and communicating with other health care professionals, Documenting clinical information in the EMR, Independently interpreting results (not separately reported), Communicating results to the patient/family/caregiver, Counseling and educating the patient/family/caregiver and Care coordination (not separately reported).

## 2022-06-04 NOTE — Assessment & Plan Note (Signed)
No ongoing evidence of infectious colitis at this time and repeat C diff and GI panel was negative after 2 courses of treatment for C diff infection.  Unclear if he actually had recurrence after initial treatment and also possible that he could have been dealing with a post-infectious irritable bowel syndrome.  For now, do not recommend further treatment and can follow up as needed since his symptoms have resolved.  Advised judicious antibiotic use in the future to hopefully prevent recurrence and could consider prophylactic therapy if other systemic antibiotics needed.

## 2022-06-09 ENCOUNTER — Ambulatory Visit: Payer: Medicare Other | Admitting: Gastroenterology

## 2022-06-09 ENCOUNTER — Encounter: Payer: Self-pay | Admitting: Gastroenterology

## 2022-06-09 VITALS — BP 134/70 | HR 80 | Ht 65.5 in | Wt 155.4 lb

## 2022-06-09 DIAGNOSIS — A0472 Enterocolitis due to Clostridium difficile, not specified as recurrent: Secondary | ICD-10-CM | POA: Diagnosis not present

## 2022-06-09 DIAGNOSIS — R933 Abnormal findings on diagnostic imaging of other parts of digestive tract: Secondary | ICD-10-CM

## 2022-06-09 NOTE — Progress Notes (Signed)
[facility] Consult Note:  History: Frederick Cisneros. 06/09/2022  Referring provider: Johny Blamer, MD  Reason for consult/chief complaint: Hx of colitis   Subjective  HPI: Frederick Cisneros was referred to [place] today  for recurrent C. difficile colitis, referral having come from Dr. Tally Joe in late June after an admission at [facility] long for C. difficile.  The discharge summary from that hospital stay indicates he had been given metronidazole weeks before as an outpatient, and then was diagnosed with C. difficile while admitted.  He had cute kidney injury from volume depletion that improved with fluids, and he was treated with oral vancomycin. He was back in the emergency department June 25 for recurrence of same symptoms and was treated with Fidaxomicin.  (No C. difficile testing done) -infectious disease consult note of 06/04/2022 indicates that he was unable to afford that medicine and that was changed back to vancomycin. He was in the ED 05/08/2022 with recurrence of same symptoms and C. difficile testing reportedly negative (see below). Infectious disease consultant reported that Frederick Cisneros had no diarrhea for 2 to 3 weeks prior to that visit, they were uncertain if he had true recurrence after the initial treatment.  They recommended no further treatment or testing at that point.  Frederick Cisneros reports that his digestive issues feel the best they have been in years.  His diarrhea resolved and he has no abdominal pain at present. Sounds like his symptoms initially started with left-sided abdominal pain, and he cannot recall if there was diarrhea at the time.  If there was not, it started soon after getting some antibiotics from primary care for suspected diverticulitis. He recalls no previous history of C. difficile.  ROS:  Review of Systems  Constitutional:  Negative for appetite change and unexpected weight change.  HENT:  Negative for mouth sores and voice change.    Eyes:  Negative for pain and redness.  Respiratory:  Negative for cough and shortness of breath.   Cardiovascular:  Negative for chest pain and palpitations.  Genitourinary:  Negative for dysuria and hematuria.  Musculoskeletal:  Negative for arthralgias and myalgias.  Skin:  Negative for pallor and rash.  Neurological:  Negative for weakness and headaches.  Hematological:  Negative for adenopathy.     Past Medical History: Past Medical History:  Diagnosis Date   Arthritis    BPH (benign prostatic hyperplasia)    C. difficile diarrhea    CAD (coronary artery disease)    Diverticulitis    Dyslipidemia    Esophageal reflux    Glaucoma    per pt   Hiatal hernia    Hypertension    LVH (left ventricular hypertrophy)    Sleep apnea    Type II or unspecified type diabetes mellitus without mention of complication, not stated as uncontrolled      Past Surgical History: Past Surgical History:  Procedure Laterality Date   CARDIAC CATHETERIZATION     CAROTID ENDARTERECTOMY     COLONOSCOPY     IR GENERIC HISTORICAL  04/29/2016   IR RADIOLOGIST EVAL & MGMT 04/29/2016 [name], DO [facility] INTERV RAD   IR RADIOLOGIST EVAL & MGMT  12/22/2016   IR RADIOLOGIST EVAL & MGMT  12/23/2017   IR RADIOLOGIST EVAL & MGMT  12/14/2018   IR RADIOLOGIST EVAL & MGMT  12/28/2019   IR RADIOLOGIST EVAL & MGMT  01/15/2021   IR RADIOLOGIST EVAL & MGMT  12/29/2021   ROTATOR CUFF REPAIR     right  STOMACH SURGERY     TRANSURETHRAL RESECTION OF PROSTATE       Family History: Family History  Problem Relation Age of Onset   Breast cancer Sister    Diabetes Sister        x 3   GI problems Father        type unknown   Colon cancer Neg Hx    Esophageal cancer Neg Hx    Stomach cancer Neg Hx    Rectal cancer Neg Hx     Social History: Social History   Socioeconomic History   Marital status: Married    Spouse name: Not on file   Number of children: 4   Years of education: Not on file   Highest  education level: Not on file  Occupational History   Occupation: retired  Tobacco Use   Smoking status: Former    Types: Cigarettes    Quit date: 10/19/1988    Years since quitting: 33.6   Smokeless tobacco: Never  Vaping Use   Vaping Use: Never used  Substance and Sexual Activity   Alcohol use: Yes    Comment: occasional wine   Drug use: No   Sexual activity: Not on file  Other Topics Concern   Not on file  Social History Narrative   Not on file   Social Determinants of Health   Financial Resource Strain: Not on file  Food Insecurity: Not on file  Transportation Needs: Not on file  Physical Activity: Not on file  Stress: Not on file  Social Connections: Not on file    Allergies: No Known Allergies  Outpatient Meds: Current Outpatient Medications  Medication Sig Dispense Refill   amLODipine (NORVASC) 5 MG tablet Take 5 mg by mouth daily.      aspirin 325 MG tablet Take 325 mg by mouth daily.     colchicine 0.6 MG tablet Take 0.6 mg by mouth 2 (two) times daily as needed (gout).     dorzolamide-timolol (COSOPT) 22.3-6.8 MG/ML ophthalmic solution Place 1 drop into both eyes 2 (two) times daily.     famotidine (PEPCID) 20 MG tablet 1 tablet at bedtime as needed     ketoconazole (NIZORAL) 2 % cream Apply 1 application topically daily as needed for irritation (itching). Reported on 11/05/2015     lisinopril (PRINIVIL,ZESTRIL) 10 MG tablet Take 10 mg by mouth daily.      metoprolol succinate (TOPROL-XL) 100 MG 24 hr tablet Take 100 mg by mouth daily. Take with or immediately following a meal.     Multiple Vitamin (MULTIVITAMIN WITH MINERALS) TABS tablet Take 1 tablet by mouth daily.     rosuvastatin (CRESTOR) 40 MG tablet Take 40 mg by mouth daily. Reported on 11/05/2015     Travoprost, BAK Free, (TRAVATAN) 0.004 % SOLN ophthalmic solution Place 1 drop into both eyes at bedtime.     No current facility-administered medications for this visit.       ___________________________________________________________________ Objective   Exam:  BP 134/70   Pulse 80   Ht 5' 5.5" (1.664 m)   Wt 155 lb 6.4 oz (70.5 kg)   SpO2 96%   BMI 25.47 kg/m  Wt Readings from Last 3 Encounters:  06/09/22 155 lb 6.4 oz (70.5 kg)  05/08/22 168 lb (76.2 kg)  04/12/22 168 lb (76.2 kg)    General: Well-appearing Eyes: sclera anicteric, no redness ENT: oral mucosa moist without lesions, no cervical or supraclavicular lymphadenopathy CV: Soft systolic murmur, regular, no JVD, no  peripheral edema Resp: clear to auscultation bilaterally, normal RR and effort noted GI: soft, no tenderness, with active bowel sounds. No guarding or palpable organomegaly noted. Skin; warm and dry, no rash or jaundice noted Neuro: awake, alert and oriented x 3. Normal gross motor function and fluent speech  Labs:  C. difficile antigen and toxin as well as GI pathogen panel negative on 05/08/2022.  CLINICAL DATA:  Abdominal pain, diarrhea, fatigue, history of C difficile   EXAM: CT ABDOMEN AND PELVIS WITH CONTRAST   TECHNIQUE: Multidetector CT imaging of the abdomen and pelvis was performed using the standard protocol following bolus administration of intravenous contrast.   RADIATION DOSE REDUCTION: This exam was performed according to the departmental dose-optimization program which includes automated exposure control, adjustment of the mA and/or kV according to patient size and/or use of iterative reconstruction technique.   CONTRAST:  62mL OMNIPAQUE IOHEXOL 300 MG/ML  SOLN   COMPARISON:  04/12/2022   FINDINGS: Lower chest: No acute abnormality.  Coronary artery calcifications.   Hepatobiliary: No solid liver abnormality is seen. No gallstones, gallbladder wall thickening, or biliary dilatation.   Pancreas: Unremarkable. No pancreatic ductal dilatation or surrounding inflammatory changes.   Spleen: Normal in size without significant  abnormality.   Adrenals/Urinary Tract: Adrenal glands are unremarkable. Kidneys are normal, without renal calculi, solid lesion, or hydronephrosis. Bladder is unremarkable.   Stomach/Bowel: Stomach is within normal limits. Appendix appears normal. Descending and sigmoid diverticulosis. Wall thickening and fat stranding about the distal descending and sigmoid, similar in appearance to prior examination (series 2, image 65).   Vascular/Lymphatic: Aortic atherosclerosis. No enlarged abdominal or pelvic lymph nodes.   Reproductive: Prostatomegaly.   Other: Small fat containing right inguinal hernia containing a portion of the urinary bladder (series 2, image 76). No ascites.   Musculoskeletal: No acute or significant osseous findings.   IMPRESSION: 1. Descending and sigmoid diverticulosis. Wall thickening and fat stranding about the distal descending and sigmoid, similar in appearance to prior examination. As previously reported, differential considerations include diverticulitis as well as other infectious or inflammatory colitis, to include C diff colitis. 2. Prostatomegaly. 3. Nonobstructive left nephrolithiasis 4. Coronary artery disease.   Aortic Atherosclerosis (ICD10-I70.0).     Electronically Signed   By: Jearld Lesch M.D.   On: 05/08/2022 17:57    Assessment: Encounter Diagnoses  Name Primary?   C. difficile colitis Yes   Abnormal finding on GI tract imaging     I agree with the impression of our infectious disease consultant that he had a prolonged course of C. difficile but finally resolved after repeat vancomycin therapy. He seemed to have postinfectious diarrhea for a few weeks afterwards that is now resolved. Regarding the CT findings suggesting colitis or diverticulitis, I think this was infectious colitis from the C. difficile.  He has no residual diarrhea and no abdominal pain or tenderness, so he does not clinically have diverticulitis at this  point. No current plans for repeat imaging or for colonoscopy.  If he develops diarrhea, he will contact us so we can do stool C. difficile testing (PCR, toxin and antigen).  Caution is warranted using antibiotics in this patient in the future (i.e. respiratory or urinary infection) since he is at increased risk for C. difficile.  He will otherwise see me as needed.  Thank you for the courtesy of this consult.  Please call me with any questions or concerns.  Frederick Cisneros  CC: Referring provider noted above

## 2022-06-09 NOTE — Patient Instructions (Signed)
Follow up with Dr. Myrtie Neither as you need him.  _______________________________________________________  If you are age 81 or older, your body mass index should be between 23-30. Your Body mass index is 25.47 kg/m. If this is out of the aforementioned range listed, please consider follow up with your Primary Care Provider.  If you are age 67 or younger, your body mass index should be between 19-25. Your Body mass index is 25.47 kg/m. If this is out of the aformentioned range listed, please consider follow up with your Primary Care Provider.   ________________________________________________________  The [facility] providers would like to encourage you to use [facility] to communicate with providers for non-urgent requests or questions.  Due to long hold times on the telephone, sending your provider a message by [facility] may be a faster and more efficient way to get a response.  Please allow 48 business hours for a response.  Please remember that this is for non-urgent requests.  _______________________________________________________

## 2022-06-15 DIAGNOSIS — K219 Gastro-esophageal reflux disease without esophagitis: Secondary | ICD-10-CM | POA: Diagnosis not present

## 2022-06-15 DIAGNOSIS — I10 Essential (primary) hypertension: Secondary | ICD-10-CM | POA: Diagnosis not present

## 2022-06-15 DIAGNOSIS — R053 Chronic cough: Secondary | ICD-10-CM | POA: Diagnosis not present

## 2022-06-16 ENCOUNTER — Ambulatory Visit
Admission: RE | Admit: 2022-06-16 | Discharge: 2022-06-16 | Disposition: A | Discharge status: Home / Self Care | Payer: Medicare Other | Source: Ambulatory Visit | Attending: Family Medicine | Admitting: Family Medicine

## 2022-06-16 ENCOUNTER — Other Ambulatory Visit: Payer: Self-pay | Admitting: Family Medicine

## 2022-06-16 DIAGNOSIS — R059 Cough, unspecified: Secondary | ICD-10-CM | POA: Diagnosis not present

## 2022-07-27 IMAGING — CT CT ABD-PELV W/ CM
2 of 5 series · 14 of 46 positions shown, 16 images · IV contrast (agent unspecified)
Comparison: Noncontrast CT 11/24/2020

CLINICAL DATA: Acute abdominal pain. Diarrhea. Recent antibiotics
for diverticulitis.

EXAM:
CT ABDOMEN AND PELVIS WITH CONTRAST
TECHNIQUE: Multidetector CT imaging of the abdomen and pelvis was performed
using the standard protocol following bolus administration of
intravenous contrast.

[Series 2: axial st · axial · 0.88mm/px · z∈[+1132,+1482]mm · 11 of 86 slices shown, 13 images]
[im 8/86  soft-tissue]
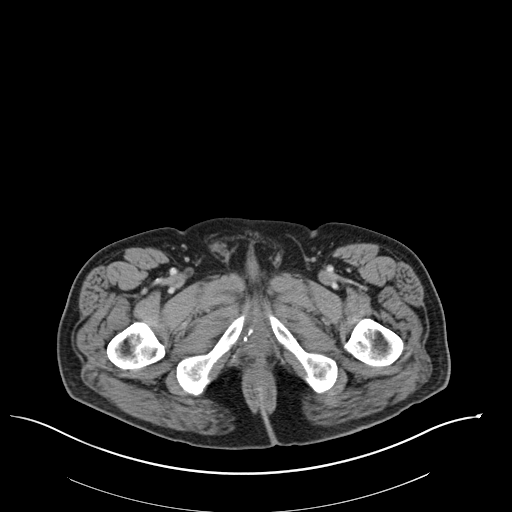
[im 8/86  bone]
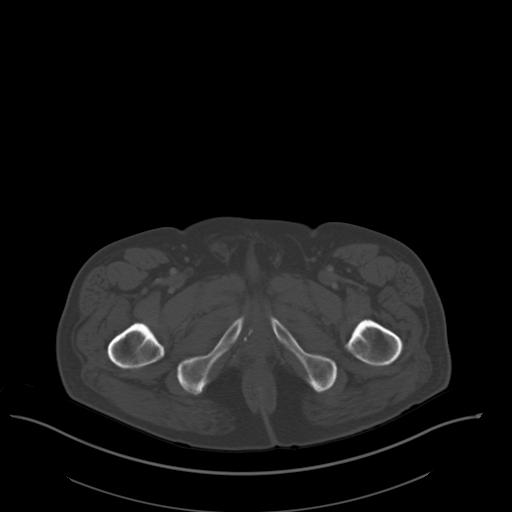
[im 15/86  soft-tissue]
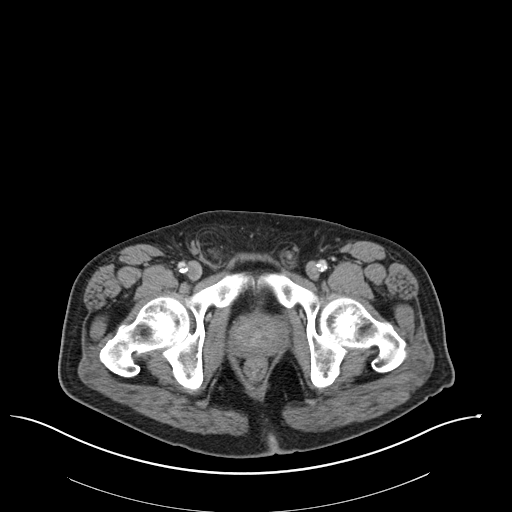
[im 22/86  soft-tissue]
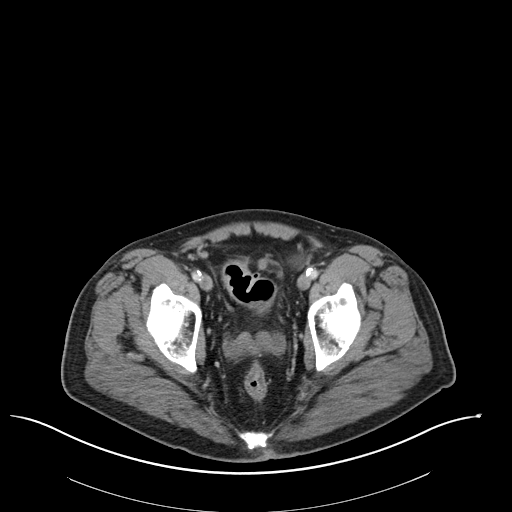
[im 29/86  soft-tissue]
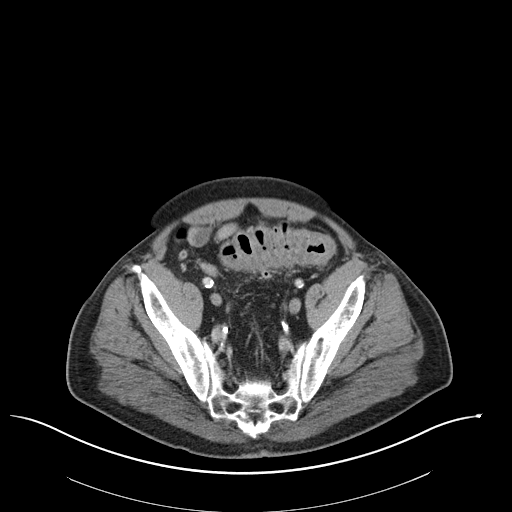
[im 36/86  soft-tissue]
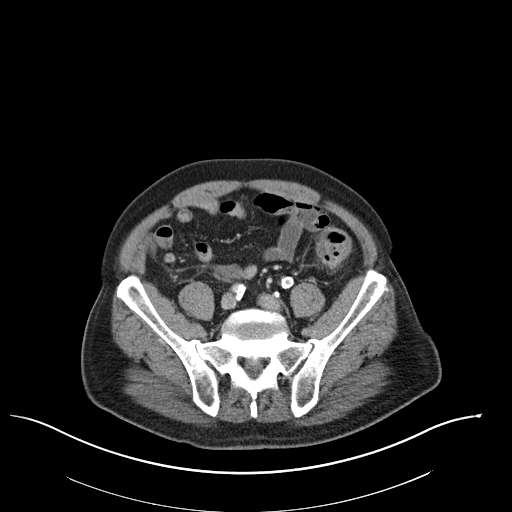
[im 43/86  soft-tissue]
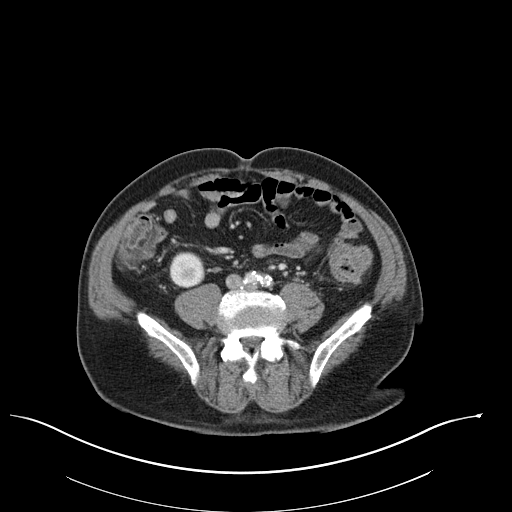
[im 50/86  soft-tissue]
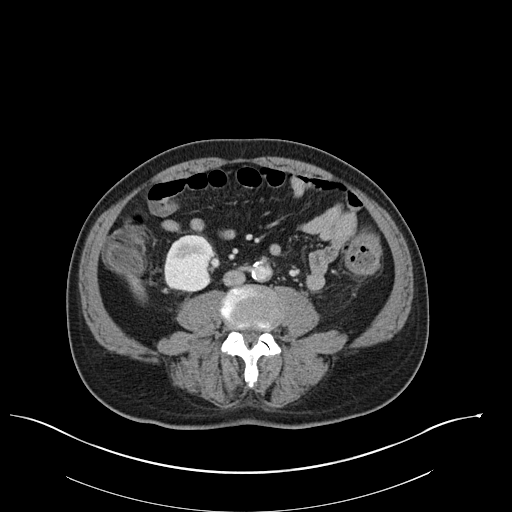
[im 57/86  soft-tissue]
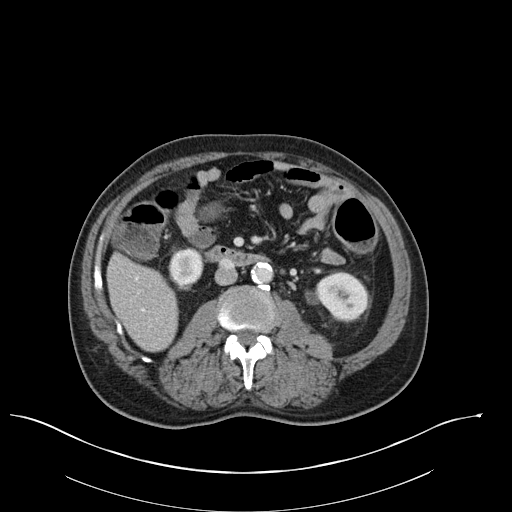
[im 64/86  soft-tissue]
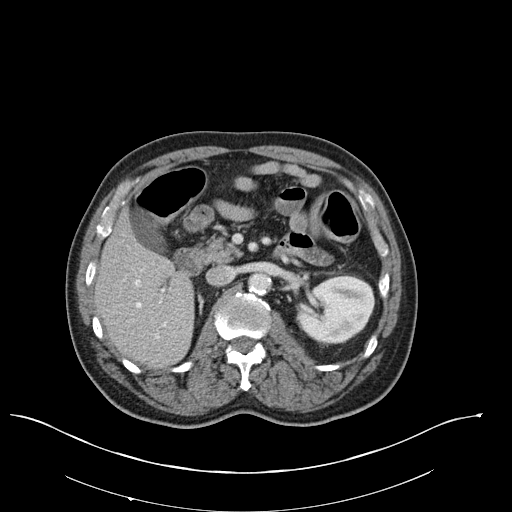
[im 64/86  bone]
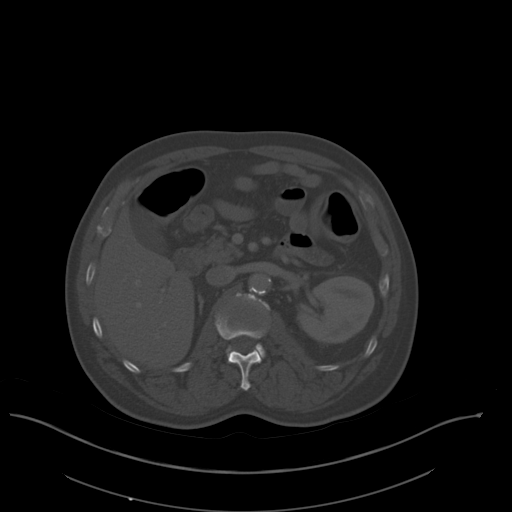
[im 71/86  soft-tissue]
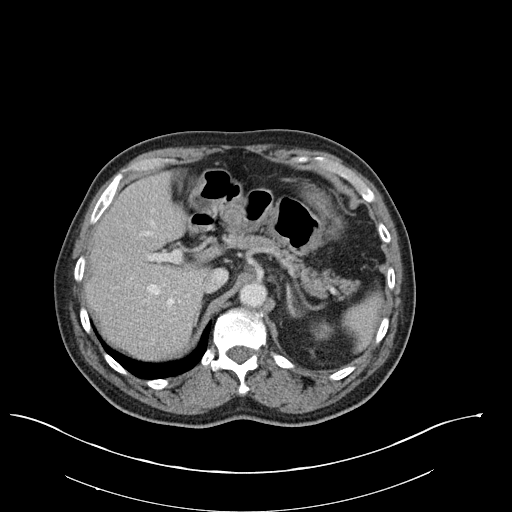
[im 78/86  soft-tissue]
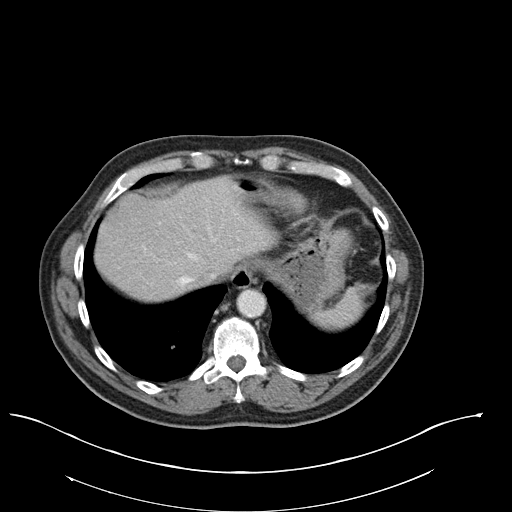

[Series 5: coronal st · coronal · 0.77mm/px · 3 of 156 slices shown]
[im 52/156  soft-tissue]
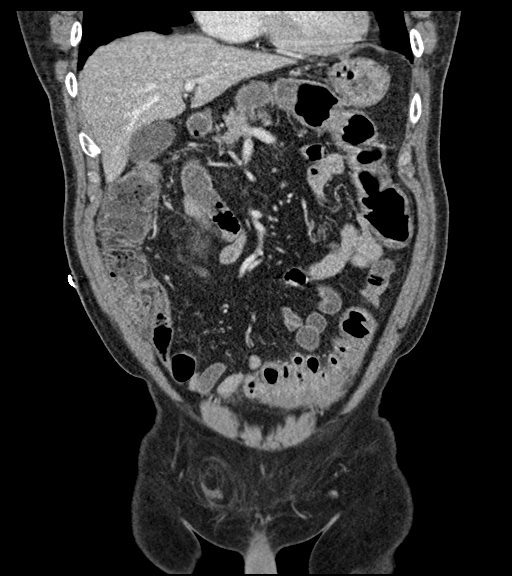
[im 69/156  soft-tissue]
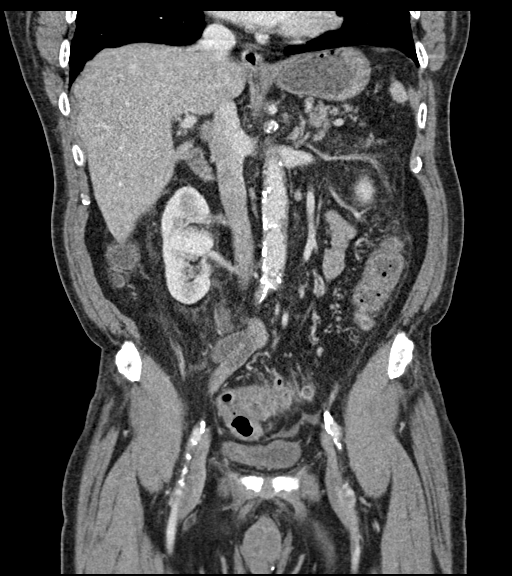
[im 87/156  soft-tissue]
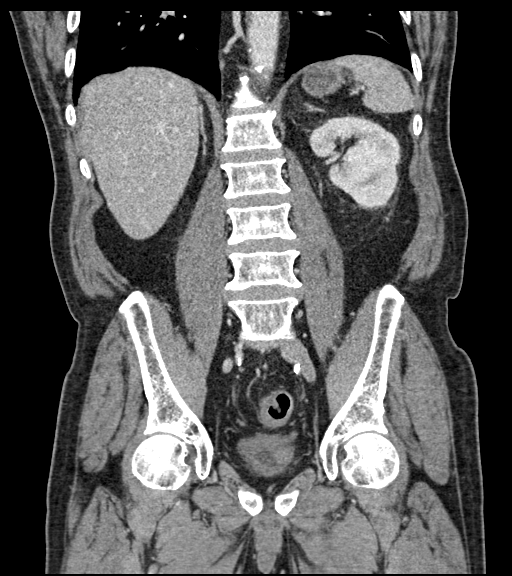

[14 of 46 positions shown; findings below may reference images not displayed]

RADIATION DOSE REDUCTION: This exam was performed according to the
departmental dose-optimization program which includes automated
exposure control, adjustment of the mA and/or kV according to
patient size and/or use of iterative reconstruction technique.

CONTRAST:  80mL OMNIPAQUE IOHEXOL 300 MG/ML  SOLN
FINDINGS: Lower chest: No focal airspace disease or pleural effusion.

Hepatobiliary: No focal liver abnormality is seen. No gallstones,
gallbladder wall thickening, or biliary dilatation.

Pancreas: No ductal dilatation or inflammation.

Spleen: Normal in size without focal abnormality.

Adrenals/Urinary Tract: Normal adrenal glands. There is minimal left
perinephric edema. Suggestion of mild proximal left ureteral
enhancement in soft tissue thickening. Punctate nonobstructing stone
in the mid left kidney. No ureteral calculi. Right kidney is
inferiorly located, but without acute findings. The urinary bladder
is minimally distended, the right aspect of the bladder extends into
a right inguinal hernia. No bladder wall thickening.

Stomach/Bowel: There is colonic wall thickening extending from the
mid descending through the sigmoid colon with mild pericolonic
edema. There are multiple diverticula in the region of colonic wall
thickening, but no definite discretely inflamed diverticulum. The
appendix is normal. There is no small bowel obstruction or small
bowel inflammation. Unremarkable appearance of the stomach.

Vascular/Lymphatic: Advanced aortic and branch atherosclerosis.
Dense calcification of the iliac arteries. No acute vascular
findings. Patent portal vein. No abdominopelvic adenopathy.

Reproductive: Enlarged prostate gland.

Other: Right inguinal hernia contains fat and right aspect of the
bladder dome. There is a small fat containing left inguinal hernia.
No abdominopelvic ascites or free air. No bowel perforation or focal
fluid collection.

Musculoskeletal: There are no acute or suspicious osseous
abnormalities.
IMPRESSION: 1. Colitis extending from the mid descending through the sigmoid
colon, likely infectious or inflammatory. There are multiple
diverticula in the region of colonic wall thickening, but no
definite discretely inflamed diverticulum.
2. There is mild left perinephric edema with slight enhancement and
thickening of the left renal pelvis, query pyelonephritis.
3. Punctate nonobstructing stone in the mid left kidney.
4. Right inguinal hernia contains fat and right aspect of the
bladder dome. Small fat containing left inguinal hernia.
5. Enlarged prostate gland.

Aortic Atherosclerosis ([id number]).

## 2022-07-29 DIAGNOSIS — Z23 Encounter for immunization: Secondary | ICD-10-CM | POA: Diagnosis not present

## 2022-07-29 DIAGNOSIS — Z0000 Encounter for general adult medical examination without abnormal findings: Secondary | ICD-10-CM | POA: Diagnosis not present

## 2022-07-29 DIAGNOSIS — I10 Essential (primary) hypertension: Secondary | ICD-10-CM | POA: Diagnosis not present

## 2022-07-29 DIAGNOSIS — K219 Gastro-esophageal reflux disease without esophagitis: Secondary | ICD-10-CM | POA: Diagnosis not present

## 2022-07-29 DIAGNOSIS — E7800 Pure hypercholesterolemia, unspecified: Secondary | ICD-10-CM | POA: Diagnosis not present

## 2022-07-29 DIAGNOSIS — I739 Peripheral vascular disease, unspecified: Secondary | ICD-10-CM | POA: Diagnosis not present

## 2022-07-29 DIAGNOSIS — E119 Type 2 diabetes mellitus without complications: Secondary | ICD-10-CM | POA: Diagnosis not present

## 2022-07-29 DIAGNOSIS — N1832 Chronic kidney disease, stage 3b: Secondary | ICD-10-CM | POA: Diagnosis not present

## 2022-07-29 DIAGNOSIS — G4733 Obstructive sleep apnea (adult) (pediatric): Secondary | ICD-10-CM | POA: Diagnosis not present

## 2022-07-29 DIAGNOSIS — I2510 Atherosclerotic heart disease of native coronary artery without angina pectoris: Secondary | ICD-10-CM | POA: Diagnosis not present

## 2022-07-29 DIAGNOSIS — H409 Unspecified glaucoma: Secondary | ICD-10-CM | POA: Diagnosis not present

## 2022-08-17 DIAGNOSIS — N179 Acute kidney failure, unspecified: Secondary | ICD-10-CM | POA: Diagnosis not present

## 2022-08-17 DIAGNOSIS — N189 Chronic kidney disease, unspecified: Secondary | ICD-10-CM | POA: Diagnosis not present

## 2022-08-17 DIAGNOSIS — R809 Proteinuria, unspecified: Secondary | ICD-10-CM | POA: Diagnosis not present

## 2022-08-17 DIAGNOSIS — N2581 Secondary hyperparathyroidism of renal origin: Secondary | ICD-10-CM | POA: Diagnosis not present

## 2022-08-17 DIAGNOSIS — D631 Anemia in chronic kidney disease: Secondary | ICD-10-CM | POA: Diagnosis not present

## 2022-08-17 DIAGNOSIS — I129 Hypertensive chronic kidney disease with stage 1 through stage 4 chronic kidney disease, or unspecified chronic kidney disease: Secondary | ICD-10-CM | POA: Diagnosis not present

## 2022-08-17 DIAGNOSIS — N1831 Chronic kidney disease, stage 3a: Secondary | ICD-10-CM | POA: Diagnosis not present

## 2022-08-17 DIAGNOSIS — E1122 Type 2 diabetes mellitus with diabetic chronic kidney disease: Secondary | ICD-10-CM | POA: Diagnosis not present

## 2022-08-26 DIAGNOSIS — H43813 Vitreous degeneration, bilateral: Secondary | ICD-10-CM | POA: Diagnosis not present

## 2022-08-26 DIAGNOSIS — H4423 Degenerative myopia, bilateral: Secondary | ICD-10-CM | POA: Diagnosis not present

## 2022-08-26 DIAGNOSIS — H401133 Primary open-angle glaucoma, bilateral, severe stage: Secondary | ICD-10-CM | POA: Diagnosis not present

## 2022-08-26 DIAGNOSIS — H2513 Age-related nuclear cataract, bilateral: Secondary | ICD-10-CM | POA: Diagnosis not present

## 2022-10-28 DIAGNOSIS — G4733 Obstructive sleep apnea (adult) (pediatric): Secondary | ICD-10-CM | POA: Diagnosis not present

## 2022-12-25 DIAGNOSIS — H401133 Primary open-angle glaucoma, bilateral, severe stage: Secondary | ICD-10-CM | POA: Diagnosis not present

## 2022-12-25 DIAGNOSIS — H43813 Vitreous degeneration, bilateral: Secondary | ICD-10-CM | POA: Diagnosis not present

## 2022-12-25 DIAGNOSIS — H2513 Age-related nuclear cataract, bilateral: Secondary | ICD-10-CM | POA: Diagnosis not present

## 2022-12-25 DIAGNOSIS — H4423 Degenerative myopia, bilateral: Secondary | ICD-10-CM | POA: Diagnosis not present

## 2023-01-07 DIAGNOSIS — E1122 Type 2 diabetes mellitus with diabetic chronic kidney disease: Secondary | ICD-10-CM | POA: Diagnosis not present

## 2023-01-07 DIAGNOSIS — N2581 Secondary hyperparathyroidism of renal origin: Secondary | ICD-10-CM | POA: Diagnosis not present

## 2023-01-07 DIAGNOSIS — N1831 Chronic kidney disease, stage 3a: Secondary | ICD-10-CM | POA: Diagnosis not present

## 2023-01-07 DIAGNOSIS — I129 Hypertensive chronic kidney disease with stage 1 through stage 4 chronic kidney disease, or unspecified chronic kidney disease: Secondary | ICD-10-CM | POA: Diagnosis not present

## 2023-01-07 DIAGNOSIS — R809 Proteinuria, unspecified: Secondary | ICD-10-CM | POA: Diagnosis not present

## 2023-01-07 DIAGNOSIS — D631 Anemia in chronic kidney disease: Secondary | ICD-10-CM | POA: Diagnosis not present

## 2023-01-07 DIAGNOSIS — N179 Acute kidney failure, unspecified: Secondary | ICD-10-CM | POA: Diagnosis not present

## 2023-01-07 DIAGNOSIS — N189 Chronic kidney disease, unspecified: Secondary | ICD-10-CM | POA: Diagnosis not present

## 2023-01-29 DIAGNOSIS — G4733 Obstructive sleep apnea (adult) (pediatric): Secondary | ICD-10-CM | POA: Diagnosis not present

## 2023-02-02 DIAGNOSIS — G4733 Obstructive sleep apnea (adult) (pediatric): Secondary | ICD-10-CM | POA: Diagnosis not present

## 2023-02-02 DIAGNOSIS — N1832 Chronic kidney disease, stage 3b: Secondary | ICD-10-CM | POA: Diagnosis not present

## 2023-02-02 DIAGNOSIS — N2581 Secondary hyperparathyroidism of renal origin: Secondary | ICD-10-CM | POA: Diagnosis not present

## 2023-02-02 DIAGNOSIS — M10071 Idiopathic gout, right ankle and foot: Secondary | ICD-10-CM | POA: Diagnosis not present

## 2023-02-02 DIAGNOSIS — Z9989 Dependence on other enabling machines and devices: Secondary | ICD-10-CM | POA: Diagnosis not present

## 2023-02-02 DIAGNOSIS — E1122 Type 2 diabetes mellitus with diabetic chronic kidney disease: Secondary | ICD-10-CM | POA: Diagnosis not present

## 2023-02-02 DIAGNOSIS — E1151 Type 2 diabetes mellitus with diabetic peripheral angiopathy without gangrene: Secondary | ICD-10-CM | POA: Diagnosis not present

## 2023-04-08 DIAGNOSIS — I10 Essential (primary) hypertension: Secondary | ICD-10-CM | POA: Diagnosis not present

## 2023-04-08 DIAGNOSIS — G4733 Obstructive sleep apnea (adult) (pediatric): Secondary | ICD-10-CM | POA: Diagnosis not present

## 2023-05-05 DIAGNOSIS — H2513 Age-related nuclear cataract, bilateral: Secondary | ICD-10-CM | POA: Diagnosis not present

## 2023-05-05 DIAGNOSIS — H401133 Primary open-angle glaucoma, bilateral, severe stage: Secondary | ICD-10-CM | POA: Diagnosis not present

## 2023-05-05 DIAGNOSIS — H4423 Degenerative myopia, bilateral: Secondary | ICD-10-CM | POA: Diagnosis not present

## 2023-05-05 DIAGNOSIS — H43813 Vitreous degeneration, bilateral: Secondary | ICD-10-CM | POA: Diagnosis not present

## 2023-05-10 DIAGNOSIS — E1122 Type 2 diabetes mellitus with diabetic chronic kidney disease: Secondary | ICD-10-CM | POA: Diagnosis not present

## 2023-05-10 DIAGNOSIS — N1832 Chronic kidney disease, stage 3b: Secondary | ICD-10-CM | POA: Diagnosis not present

## 2023-05-10 DIAGNOSIS — I129 Hypertensive chronic kidney disease with stage 1 through stage 4 chronic kidney disease, or unspecified chronic kidney disease: Secondary | ICD-10-CM | POA: Diagnosis not present

## 2023-05-10 DIAGNOSIS — N1831 Chronic kidney disease, stage 3a: Secondary | ICD-10-CM | POA: Diagnosis not present

## 2023-05-10 DIAGNOSIS — N2581 Secondary hyperparathyroidism of renal origin: Secondary | ICD-10-CM | POA: Diagnosis not present

## 2023-05-10 DIAGNOSIS — D631 Anemia in chronic kidney disease: Secondary | ICD-10-CM | POA: Diagnosis not present

## 2023-05-10 DIAGNOSIS — N189 Chronic kidney disease, unspecified: Secondary | ICD-10-CM | POA: Diagnosis not present

## 2023-05-14 DIAGNOSIS — K5900 Constipation, unspecified: Secondary | ICD-10-CM | POA: Diagnosis not present

## 2023-05-14 DIAGNOSIS — R109 Unspecified abdominal pain: Secondary | ICD-10-CM | POA: Diagnosis not present

## 2023-05-14 DIAGNOSIS — R053 Chronic cough: Secondary | ICD-10-CM | POA: Diagnosis not present

## 2023-05-14 DIAGNOSIS — M109 Gout, unspecified: Secondary | ICD-10-CM | POA: Diagnosis not present

## 2023-05-28 DIAGNOSIS — G4733 Obstructive sleep apnea (adult) (pediatric): Secondary | ICD-10-CM | POA: Diagnosis not present

## 2023-05-29 DIAGNOSIS — E109 Type 1 diabetes mellitus without complications: Secondary | ICD-10-CM | POA: Diagnosis not present

## 2023-06-23 DIAGNOSIS — H43813 Vitreous degeneration, bilateral: Secondary | ICD-10-CM | POA: Diagnosis not present

## 2023-06-23 DIAGNOSIS — H4423 Degenerative myopia, bilateral: Secondary | ICD-10-CM | POA: Diagnosis not present

## 2023-06-23 DIAGNOSIS — H401133 Primary open-angle glaucoma, bilateral, severe stage: Secondary | ICD-10-CM | POA: Diagnosis not present

## 2023-06-23 DIAGNOSIS — H2513 Age-related nuclear cataract, bilateral: Secondary | ICD-10-CM | POA: Diagnosis not present

## 2023-08-10 DIAGNOSIS — R053 Chronic cough: Secondary | ICD-10-CM | POA: Diagnosis not present

## 2023-08-10 DIAGNOSIS — K219 Gastro-esophageal reflux disease without esophagitis: Secondary | ICD-10-CM | POA: Diagnosis not present

## 2023-08-10 DIAGNOSIS — N1832 Chronic kidney disease, stage 3b: Secondary | ICD-10-CM | POA: Diagnosis not present

## 2023-08-10 DIAGNOSIS — Z8739 Personal history of other diseases of the musculoskeletal system and connective tissue: Secondary | ICD-10-CM | POA: Diagnosis not present

## 2023-08-10 DIAGNOSIS — G4733 Obstructive sleep apnea (adult) (pediatric): Secondary | ICD-10-CM | POA: Diagnosis not present

## 2023-08-10 DIAGNOSIS — E1151 Type 2 diabetes mellitus with diabetic peripheral angiopathy without gangrene: Secondary | ICD-10-CM | POA: Diagnosis not present

## 2023-08-10 DIAGNOSIS — Z9989 Dependence on other enabling machines and devices: Secondary | ICD-10-CM | POA: Diagnosis not present

## 2023-08-10 DIAGNOSIS — Z0000 Encounter for general adult medical examination without abnormal findings: Secondary | ICD-10-CM | POA: Diagnosis not present

## 2023-08-10 DIAGNOSIS — E1122 Type 2 diabetes mellitus with diabetic chronic kidney disease: Secondary | ICD-10-CM | POA: Diagnosis not present

## 2023-08-10 DIAGNOSIS — E7800 Pure hypercholesterolemia, unspecified: Secondary | ICD-10-CM | POA: Diagnosis not present

## 2023-08-10 DIAGNOSIS — I10 Essential (primary) hypertension: Secondary | ICD-10-CM | POA: Diagnosis not present

## 2023-08-10 DIAGNOSIS — N2581 Secondary hyperparathyroidism of renal origin: Secondary | ICD-10-CM | POA: Diagnosis not present

## 2023-08-12 ENCOUNTER — Other Ambulatory Visit: Payer: Self-pay | Admitting: Family Medicine

## 2023-08-12 DIAGNOSIS — R053 Chronic cough: Secondary | ICD-10-CM

## 2023-08-13 DIAGNOSIS — E1122 Type 2 diabetes mellitus with diabetic chronic kidney disease: Secondary | ICD-10-CM | POA: Diagnosis not present

## 2023-08-13 DIAGNOSIS — D631 Anemia in chronic kidney disease: Secondary | ICD-10-CM | POA: Diagnosis not present

## 2023-08-13 DIAGNOSIS — N2581 Secondary hyperparathyroidism of renal origin: Secondary | ICD-10-CM | POA: Diagnosis not present

## 2023-08-13 DIAGNOSIS — I129 Hypertensive chronic kidney disease with stage 1 through stage 4 chronic kidney disease, or unspecified chronic kidney disease: Secondary | ICD-10-CM | POA: Diagnosis not present

## 2023-08-13 DIAGNOSIS — N1832 Chronic kidney disease, stage 3b: Secondary | ICD-10-CM | POA: Diagnosis not present

## 2023-08-20 ENCOUNTER — Ambulatory Visit
Admission: RE | Admit: 2023-08-20 | Discharge: 2023-08-20 | Disposition: A | Discharge status: Home / Self Care | Payer: Medicare Other | Source: Ambulatory Visit | Attending: Family Medicine | Admitting: Family Medicine

## 2023-08-20 DIAGNOSIS — R918 Other nonspecific abnormal finding of lung field: Secondary | ICD-10-CM | POA: Diagnosis not present

## 2023-08-20 DIAGNOSIS — I700 Atherosclerosis of aorta: Secondary | ICD-10-CM | POA: Diagnosis not present

## 2023-08-20 DIAGNOSIS — J439 Emphysema, unspecified: Secondary | ICD-10-CM | POA: Diagnosis not present

## 2023-08-20 DIAGNOSIS — R911 Solitary pulmonary nodule: Secondary | ICD-10-CM | POA: Diagnosis not present

## 2023-08-20 DIAGNOSIS — R053 Chronic cough: Secondary | ICD-10-CM

## 2023-10-22 DIAGNOSIS — H2512 Age-related nuclear cataract, left eye: Secondary | ICD-10-CM | POA: Diagnosis not present

## 2023-10-22 DIAGNOSIS — H43813 Vitreous degeneration, bilateral: Secondary | ICD-10-CM | POA: Diagnosis not present

## 2023-10-22 DIAGNOSIS — H401133 Primary open-angle glaucoma, bilateral, severe stage: Secondary | ICD-10-CM | POA: Diagnosis not present

## 2023-10-22 DIAGNOSIS — H4423 Degenerative myopia, bilateral: Secondary | ICD-10-CM | POA: Diagnosis not present

## 2023-10-28 DIAGNOSIS — H2512 Age-related nuclear cataract, left eye: Secondary | ICD-10-CM | POA: Diagnosis not present

## 2023-10-28 DIAGNOSIS — H401123 Primary open-angle glaucoma, left eye, severe stage: Secondary | ICD-10-CM | POA: Diagnosis not present

## 2023-12-06 DIAGNOSIS — H2511 Age-related nuclear cataract, right eye: Secondary | ICD-10-CM | POA: Diagnosis not present

## 2023-12-07 DIAGNOSIS — N2581 Secondary hyperparathyroidism of renal origin: Secondary | ICD-10-CM | POA: Diagnosis not present

## 2023-12-07 DIAGNOSIS — N189 Chronic kidney disease, unspecified: Secondary | ICD-10-CM | POA: Diagnosis not present

## 2023-12-07 DIAGNOSIS — E1122 Type 2 diabetes mellitus with diabetic chronic kidney disease: Secondary | ICD-10-CM | POA: Diagnosis not present

## 2023-12-07 DIAGNOSIS — N1832 Chronic kidney disease, stage 3b: Secondary | ICD-10-CM | POA: Diagnosis not present

## 2023-12-07 DIAGNOSIS — D631 Anemia in chronic kidney disease: Secondary | ICD-10-CM | POA: Diagnosis not present

## 2023-12-07 DIAGNOSIS — I129 Hypertensive chronic kidney disease with stage 1 through stage 4 chronic kidney disease, or unspecified chronic kidney disease: Secondary | ICD-10-CM | POA: Diagnosis not present

## 2023-12-09 DIAGNOSIS — H2511 Age-related nuclear cataract, right eye: Secondary | ICD-10-CM | POA: Diagnosis not present

## 2023-12-09 DIAGNOSIS — H401113 Primary open-angle glaucoma, right eye, severe stage: Secondary | ICD-10-CM | POA: Diagnosis not present

## 2024-01-10 ENCOUNTER — Other Ambulatory Visit: Payer: Self-pay | Admitting: Interventional Radiology

## 2024-01-10 DIAGNOSIS — I739 Peripheral vascular disease, unspecified: Secondary | ICD-10-CM

## 2024-01-17 ENCOUNTER — Ambulatory Visit
Admission: RE | Admit: 2024-01-17 | Discharge: 2024-01-17 | Disposition: A | Discharge status: Home / Self Care | Source: Ambulatory Visit | Attending: Interventional Radiology

## 2024-01-17 DIAGNOSIS — I739 Peripheral vascular disease, unspecified: Secondary | ICD-10-CM | POA: Diagnosis not present

## 2024-01-21 ENCOUNTER — Telehealth

## 2024-02-09 DIAGNOSIS — H31013 Macula scars of posterior pole (postinflammatory) (post-traumatic), bilateral: Secondary | ICD-10-CM | POA: Diagnosis not present

## 2024-02-09 DIAGNOSIS — H33311 Horseshoe tear of retina without detachment, right eye: Secondary | ICD-10-CM | POA: Diagnosis not present

## 2024-02-09 DIAGNOSIS — H35373 Puckering of macula, bilateral: Secondary | ICD-10-CM | POA: Diagnosis not present

## 2024-02-09 DIAGNOSIS — H43813 Vitreous degeneration, bilateral: Secondary | ICD-10-CM | POA: Diagnosis not present

## 2024-02-09 DIAGNOSIS — H3589 Other specified retinal disorders: Secondary | ICD-10-CM | POA: Diagnosis not present

## 2024-02-16 ENCOUNTER — Ambulatory Visit
Admission: RE | Admit: 2024-02-16 | Discharge: 2024-02-16 | Disposition: A | Discharge status: Home / Self Care | Source: Ambulatory Visit | Attending: Interventional Radiology

## 2024-02-16 DIAGNOSIS — E7800 Pure hypercholesterolemia, unspecified: Secondary | ICD-10-CM | POA: Diagnosis not present

## 2024-02-16 DIAGNOSIS — Z8739 Personal history of other diseases of the musculoskeletal system and connective tissue: Secondary | ICD-10-CM | POA: Diagnosis not present

## 2024-02-16 DIAGNOSIS — G4733 Obstructive sleep apnea (adult) (pediatric): Secondary | ICD-10-CM | POA: Diagnosis not present

## 2024-02-16 DIAGNOSIS — R9389 Abnormal findings on diagnostic imaging of other specified body structures: Secondary | ICD-10-CM | POA: Diagnosis not present

## 2024-02-16 DIAGNOSIS — K219 Gastro-esophageal reflux disease without esophagitis: Secondary | ICD-10-CM | POA: Diagnosis not present

## 2024-02-16 DIAGNOSIS — I739 Peripheral vascular disease, unspecified: Secondary | ICD-10-CM | POA: Diagnosis not present

## 2024-02-16 DIAGNOSIS — I10 Essential (primary) hypertension: Secondary | ICD-10-CM | POA: Diagnosis not present

## 2024-02-16 DIAGNOSIS — L309 Dermatitis, unspecified: Secondary | ICD-10-CM | POA: Diagnosis not present

## 2024-02-16 DIAGNOSIS — E1122 Type 2 diabetes mellitus with diabetic chronic kidney disease: Secondary | ICD-10-CM | POA: Diagnosis not present

## 2024-02-16 DIAGNOSIS — N1832 Chronic kidney disease, stage 3b: Secondary | ICD-10-CM | POA: Diagnosis not present

## 2024-02-16 DIAGNOSIS — N2581 Secondary hyperparathyroidism of renal origin: Secondary | ICD-10-CM | POA: Diagnosis not present

## 2024-03-01 ENCOUNTER — Encounter: Payer: Self-pay | Admitting: Pulmonary Disease

## 2024-03-01 ENCOUNTER — Ambulatory Visit: Admitting: Pulmonary Disease

## 2024-03-01 VITALS — BP 122/64 | HR 60 | Ht 65.0 in | Wt 172.2 lb

## 2024-03-01 DIAGNOSIS — J432 Centrilobular emphysema: Secondary | ICD-10-CM

## 2024-03-01 DIAGNOSIS — R053 Chronic cough: Secondary | ICD-10-CM | POA: Diagnosis not present

## 2024-03-01 DIAGNOSIS — J479 Bronchiectasis, uncomplicated: Secondary | ICD-10-CM | POA: Diagnosis not present

## 2024-03-01 MED ORDER — STIOLTO RESPIMAT 2.5-2.5 MCG/ACT IN AERS: 2.0000 | INHALATION_SPRAY | Freq: Every day | RESPIRATORY_TRACT | 5 refills | Status: DC

## 2024-03-01 MED ORDER — ALBUTEROL SULFATE HFA 108 (90 BASE) MCG/ACT IN AERS: 2.0000 | INHALATION_SPRAY | Freq: Four times a day (QID) | RESPIRATORY_TRACT | 6 refills | Status: AC | PRN

## 2024-03-01 NOTE — Progress Notes (Signed)
 Synopsis: Referred in May 2025 for Cough  Subjective:   PATIENT ID: Frederick Cisneros. GENDER: male DOB: October 29, 1940, MRN: [id number]  HPI  Chief Complaint  Patient presents with   Cough    Clear, heavy sputum w/ no chest pain    Frederick Cisneros is an 83 year old male, former smoker with CAD, GERD, hiatal hernia, OSA and DMII who is referred to pulmonary clinic for cough.   He experiences a chronic cough with heavy, clear mucus and a sensation of something stuck in his throat. There is no wheezing or hemoptysis, but mild exertional dyspnea is present.  He has a significant smoking history, having quit in 1990 after smoking one to one and a half packs per day for 20 to 30 years. There was a past suspicion of acid reflux, though heartburn is not currently severe.  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 He denies any history of pneumonia and does not have pets at home.  Past Medical History:  Diagnosis Date   Arthritis    BPH (benign prostatic hyperplasia)    C. difficile diarrhea    CAD (coronary artery disease)    Diverticulitis    Dyslipidemia    Esophageal reflux    Glaucoma    per pt   Hiatal hernia    Hypertension    LVH (left ventricular hypertrophy)    Sleep apnea    Type II or unspecified type diabetes mellitus without mention of complication, not stated as uncontrolled      Family History  Problem Relation Age of Onset   Breast cancer Sister    Diabetes Sister        x 3   GI problems Father        type unknown   Colon cancer Neg Hx    Esophageal cancer Neg Hx    Stomach cancer Neg Hx    Rectal cancer Neg Hx      Social History   Socioeconomic History   Marital status: Married    Spouse name: Not on file   Number of children: 4   Years of education: Not on file   Highest education level: Not on file  Occupational History   Occupation: retired  Tobacco Use   Smoking status: Former     Current packs/day: 0.00    Types: Cigarettes    Quit date: 10/19/1988    Years since quitting: 35.3   Smokeless tobacco: Never  Vaping Use   Vaping status: Never Used  Substance and Sexual Activity   Alcohol use: Yes    Comment: occasional wine   Drug use: No   Sexual activity: Not on file  Other Topics Concern   Not on file  Social History Narrative   Not on file   Social Drivers of Health   Financial Resource Strain: Not on file  Food Insecurity: Not on file  Transportation Needs: Not on file  Physical Activity: Not on file  Stress: Not on file  Social Connections: Not on file  Intimate Partner Violence: Not on file     No Known Allergies   Outpatient Medications Prior to Visit  Medication Sig Dispense Refill   amLODipine  (NORVASC ) 5 MG tablet Take 5 mg by mouth daily.      aspirin 325 MG tablet Take 325 mg by mouth daily.     colchicine 0.6 MG tablet Take 0.6 mg by mouth 2 (two) times daily as  needed (gout).     dorzolamide -timolol  (COSOPT ) 22.3-6.8 MG/ML ophthalmic solution Place 1 drop into both eyes 2 (two) times daily.     famotidine  (PEPCID ) 20 MG tablet 1 tablet at bedtime as needed     irbesartan (AVAPRO) 300 MG tablet Take 300 mg by mouth daily.     ketoconazole (NIZORAL) 2 % cream Apply 1 application topically daily as needed for irritation (itching). Reported on 11/05/2015     metoprolol  succinate (TOPROL -XL) 100 MG 24 hr tablet Take 100 mg by mouth daily. Take with or immediately following a meal.     Multiple Vitamin (MULTIVITAMIN WITH MINERALS) TABS tablet Take 1 tablet by mouth daily.     rosuvastatin  (CRESTOR ) 40 MG tablet Take 40 mg by mouth daily. Reported on 11/05/2015     Travoprost, BAK Free, (TRAVATAN) 0.004 % SOLN ophthalmic solution Place 1 drop into both eyes at bedtime.     lisinopril (PRINIVIL,ZESTRIL) 10 MG tablet Take 10 mg by mouth daily.      No facility-administered medications prior to visit.    Review of Systems  Constitutional:   Negative for chills, fever, malaise/fatigue and weight loss.  HENT:  Negative for congestion, sinus pain and sore throat.   Eyes: Negative.   Respiratory:  Positive for cough and sputum production. Negative for hemoptysis, shortness of breath and wheezing.   Cardiovascular:  Negative for chest pain, palpitations, orthopnea, claudication and leg swelling.  Gastrointestinal:  Negative for abdominal pain, heartburn, nausea and vomiting.  Genitourinary: Negative.   Musculoskeletal:  Positive for joint pain. Negative for myalgias.  Skin:  Negative for rash.  Neurological:  Negative for weakness.  Endo/Heme/Allergies: Negative.   Psychiatric/Behavioral: Negative.      Objective:   Vitals:   03/01/24 1345 03/01/24 1348  BP: 122/64 122/64  Pulse:  60  SpO2:  98%  Weight:  172 lb 3.2 oz (78.1 kg)  Height:  5\' 5"  (1.651 m)     Physical Exam Constitutional:      General: He is not in acute distress.    Appearance: Normal appearance.  Eyes:     General: No scleral icterus.    Conjunctiva/sclera: Conjunctivae normal.  Cardiovascular:     Rate and Rhythm: Normal rate and regular rhythm.  Pulmonary:     Breath sounds: No wheezing, rhonchi or rales.  Musculoskeletal:     Right lower leg: No edema.     Left lower leg: No edema.  Skin:    General: Skin is warm and dry.  Neurological:     General: No focal deficit present.     CBC    Component Value Date/Time   WBC 7.3 05/08/2022 1404   RBC 4.33 05/08/2022 1404   HGB 13.1 05/08/2022 1404   HCT 39.6 05/08/2022 1404   PLT 190 05/08/2022 1404   MCV 91.5 05/08/2022 1404   MCH 30.3 05/08/2022 1404   MCHC 33.1 05/08/2022 1404   RDW 14.0 05/08/2022 1404   LYMPHSABS 1.9 05/08/2022 1404   MONOABS 1.3 (H) 05/08/2022 1404   EOSABS 0.2 05/08/2022 1404   BASOSABS 0.0 05/08/2022 1404      Latest Ref Rng & Units 05/08/2022    2:04 PM 04/12/2022    1:17 PM 03/28/2022    6:10 AM  BMP  Glucose 70 - 99 mg/dL 409  811  914   BUN 8 - 23  mg/dL 21  23  13    Creatinine 0.61 - 1.24 mg/dL 7.82  9.56  2.13  Sodium 135 - 145 mmol/L 136  136  142   Potassium 3.5 - 5.1 mmol/L 3.8  4.0  3.4   Chloride 98 - 111 mmol/L 105  106  112   CO2 22 - 32 mmol/L 23  21  24    Calcium  8.9 - 10.3 mg/dL 9.3  9.7  8.8    Chest imaging: CT Chest 08/20/23 Mediastinum/Nodes: No pathologically enlarged mediastinal or hilar lymph nodes. Please note that accurate exclusion of hilar adenopathy is limited on noncontrast CT scans. Esophagus is unremarkable in appearance.   Lungs/Pleura: 4 mm pulmonary nodule in the posterior aspect of the left upper lobe (axial image 46 of series 4), nonspecific. No other larger more suspicious appearing pulmonary nodules or masses are noted. No confluent consolidative airspace disease. No pleural effusions. However, there is mild ground-glass attenuation noted throughout the mid to lower lungs bilaterally, most evident in the lower lobes of the lungs. Mild cylindrical bronchiectasis is also evident in the lower lobes of the lungs bilaterally (right greater than left). Mild centrilobular and paraseptal emphysema.  PFT:     No data to display          Labs:  Path:  Echo:  Heart Catheterization:    Assessment & Plan:   Chronic cough  Bronchiectasis without complication (HCC)  Centrilobular emphysema (HCC)  Discussion: Frederick Cisneros is an 83 year old male, former smoker with CAD, GERD, hiatal hernia, OSA and DMII who is referred to pulmonary clinic for cough.   Emphysema Bronchiectasis - Prescribed Stiolto inhaler, two puffs in the morning. Reviewed how to use inhaler today. - Prescribed albuterol inhaler as needed, one to two puffs every four to six hours. - Ordered pulmonary function tests. - Scheduled follow-up CT chest scan in November.  Chronic cough Chronic cough likely multifactorial, related to bronchiectasis, possible sinus drainage, and potential reflux. - Evaluate inhaler  therapy effectiveness at follow-up. - consider nasal sprays or reflux treatment at follow up if not improved  Follow up in 3 months with PFTs.  Duaine German, MD [facility] Office: [phone]    Current Outpatient Medications:    amLODipine  (NORVASC ) 5 MG tablet, Take 5 mg by mouth daily. , Disp: , Rfl:    aspirin 325 MG tablet, Take 325 mg by mouth daily., Disp: , Rfl:    colchicine 0.6 MG tablet, Take 0.6 mg by mouth 2 (two) times daily as needed (gout)., Disp: , Rfl:    dorzolamide -timolol  (COSOPT ) 22.3-6.8 MG/ML ophthalmic solution, Place 1 drop into both eyes 2 (two) times daily., Disp: , Rfl:    famotidine  (PEPCID ) 20 MG tablet, 1 tablet at bedtime as needed, Disp: , Rfl:    irbesartan (AVAPRO) 300 MG tablet, Take 300 mg by mouth daily., Disp: , Rfl:    ketoconazole (NIZORAL) 2 % cream, Apply 1 application topically daily as needed for irritation (itching). Reported on 11/05/2015, Disp: , Rfl:    metoprolol  succinate (TOPROL -XL) 100 MG 24 hr tablet, Take 100 mg by mouth daily. Take with or immediately following a meal., Disp: , Rfl:    Multiple Vitamin (MULTIVITAMIN WITH MINERALS) TABS tablet, Take 1 tablet by mouth daily., Disp: , Rfl:    rosuvastatin  (CRESTOR ) 40 MG tablet, Take 40 mg by mouth daily. Reported on 11/05/2015, Disp: , Rfl:    Travoprost, BAK Free, (TRAVATAN) 0.004 % SOLN ophthalmic solution, Place 1 drop into both eyes at bedtime., Disp: , Rfl:

## 2024-03-01 NOTE — Patient Instructions (Addendum)
 Start stiolto inhaler 2 puffs daily  Use albuterol inhaler 1-2 puffs every 4-6 hours as needed for cough  We will schedule you for breathing tests at the front desk  We will repeat a CT Chest scan in 08/2024  Follow up in 3 months for breathing tests and appointment

## 2024-03-10 DIAGNOSIS — H43813 Vitreous degeneration, bilateral: Secondary | ICD-10-CM | POA: Diagnosis not present

## 2024-03-10 DIAGNOSIS — H31013 Macula scars of posterior pole (postinflammatory) (post-traumatic), bilateral: Secondary | ICD-10-CM | POA: Diagnosis not present

## 2024-03-10 DIAGNOSIS — H31091 Other chorioretinal scars, right eye: Secondary | ICD-10-CM | POA: Diagnosis not present

## 2024-03-10 DIAGNOSIS — H35373 Puckering of macula, bilateral: Secondary | ICD-10-CM | POA: Diagnosis not present

## 2024-03-10 DIAGNOSIS — H43391 Other vitreous opacities, right eye: Secondary | ICD-10-CM | POA: Diagnosis not present

## 2024-03-18 DIAGNOSIS — I10 Essential (primary) hypertension: Secondary | ICD-10-CM | POA: Diagnosis not present

## 2024-03-18 DIAGNOSIS — E1122 Type 2 diabetes mellitus with diabetic chronic kidney disease: Secondary | ICD-10-CM | POA: Diagnosis not present

## 2024-03-18 DIAGNOSIS — E7800 Pure hypercholesterolemia, unspecified: Secondary | ICD-10-CM | POA: Diagnosis not present

## 2024-03-18 DIAGNOSIS — N1832 Chronic kidney disease, stage 3b: Secondary | ICD-10-CM | POA: Diagnosis not present

## 2024-04-03 DIAGNOSIS — D631 Anemia in chronic kidney disease: Secondary | ICD-10-CM | POA: Diagnosis not present

## 2024-04-03 DIAGNOSIS — I129 Hypertensive chronic kidney disease with stage 1 through stage 4 chronic kidney disease, or unspecified chronic kidney disease: Secondary | ICD-10-CM | POA: Diagnosis not present

## 2024-04-03 DIAGNOSIS — E1122 Type 2 diabetes mellitus with diabetic chronic kidney disease: Secondary | ICD-10-CM | POA: Diagnosis not present

## 2024-04-03 DIAGNOSIS — N2581 Secondary hyperparathyroidism of renal origin: Secondary | ICD-10-CM | POA: Diagnosis not present

## 2024-04-03 DIAGNOSIS — N1832 Chronic kidney disease, stage 3b: Secondary | ICD-10-CM | POA: Diagnosis not present

## 2024-04-17 DIAGNOSIS — H43813 Vitreous degeneration, bilateral: Secondary | ICD-10-CM | POA: Diagnosis not present

## 2024-04-17 DIAGNOSIS — N1832 Chronic kidney disease, stage 3b: Secondary | ICD-10-CM | POA: Diagnosis not present

## 2024-04-17 DIAGNOSIS — I10 Essential (primary) hypertension: Secondary | ICD-10-CM | POA: Diagnosis not present

## 2024-04-17 DIAGNOSIS — E7800 Pure hypercholesterolemia, unspecified: Secondary | ICD-10-CM | POA: Diagnosis not present

## 2024-04-17 DIAGNOSIS — E1122 Type 2 diabetes mellitus with diabetic chronic kidney disease: Secondary | ICD-10-CM | POA: Diagnosis not present

## 2024-04-17 DIAGNOSIS — Z961 Presence of intraocular lens: Secondary | ICD-10-CM | POA: Diagnosis not present

## 2024-04-17 DIAGNOSIS — H401133 Primary open-angle glaucoma, bilateral, severe stage: Secondary | ICD-10-CM | POA: Diagnosis not present

## 2024-04-26 DIAGNOSIS — G4733 Obstructive sleep apnea (adult) (pediatric): Secondary | ICD-10-CM | POA: Diagnosis not present

## 2024-05-18 DIAGNOSIS — I10 Essential (primary) hypertension: Secondary | ICD-10-CM | POA: Diagnosis not present

## 2024-05-18 DIAGNOSIS — N1832 Chronic kidney disease, stage 3b: Secondary | ICD-10-CM | POA: Diagnosis not present

## 2024-05-18 DIAGNOSIS — E1122 Type 2 diabetes mellitus with diabetic chronic kidney disease: Secondary | ICD-10-CM | POA: Diagnosis not present

## 2024-05-18 DIAGNOSIS — E7800 Pure hypercholesterolemia, unspecified: Secondary | ICD-10-CM | POA: Diagnosis not present

## 2024-06-01 DIAGNOSIS — M79606 Pain in leg, unspecified: Secondary | ICD-10-CM | POA: Diagnosis not present

## 2024-06-01 DIAGNOSIS — E1122 Type 2 diabetes mellitus with diabetic chronic kidney disease: Secondary | ICD-10-CM | POA: Diagnosis not present

## 2024-06-01 DIAGNOSIS — I739 Peripheral vascular disease, unspecified: Secondary | ICD-10-CM | POA: Diagnosis not present

## 2024-06-01 DIAGNOSIS — N1832 Chronic kidney disease, stage 3b: Secondary | ICD-10-CM | POA: Diagnosis not present

## 2024-06-01 DIAGNOSIS — I7409 Other arterial embolism and thrombosis of abdominal aorta: Secondary | ICD-10-CM | POA: Diagnosis not present

## 2024-06-08 ENCOUNTER — Ambulatory Visit: Admitting: Pulmonary Disease

## 2024-06-08 ENCOUNTER — Ambulatory Visit (INDEPENDENT_AMBULATORY_CARE_PROVIDER_SITE_OTHER): Admitting: Pulmonary Disease

## 2024-06-08 ENCOUNTER — Encounter: Payer: Self-pay | Admitting: Pulmonary Disease

## 2024-06-08 VITALS — BP 120/58 | HR 60 | Ht 66.0 in | Wt 171.0 lb

## 2024-06-08 DIAGNOSIS — J432 Centrilobular emphysema: Secondary | ICD-10-CM

## 2024-06-08 DIAGNOSIS — J479 Bronchiectasis, uncomplicated: Secondary | ICD-10-CM | POA: Diagnosis not present

## 2024-06-08 DIAGNOSIS — R918 Other nonspecific abnormal finding of lung field: Secondary | ICD-10-CM | POA: Diagnosis not present

## 2024-06-08 DIAGNOSIS — R0982 Postnasal drip: Secondary | ICD-10-CM

## 2024-06-08 DIAGNOSIS — Z87891 Personal history of nicotine dependence: Secondary | ICD-10-CM | POA: Diagnosis not present

## 2024-06-08 LAB — PULMONARY FUNCTION TEST
DL/VA % pred: 118 %
DL/VA: 4.62 ml/min/mmHg/L
DLCO cor % pred: 76 %
DLCO cor: 16.21 ml/min/mmHg
DLCO unc % pred: 76 %
DLCO unc: 16.21 ml/min/mmHg
FEF 25-75 Post: 2.18 L/s
FEF 25-75 Pre: 2.05 L/s
FEF2575-%Change-Post: 6 %
FEF2575-%Pred-Post: 149 %
FEF2575-%Pred-Pre: 140 %
FEV1-%Change-Post: 0 %
FEV1-%Pred-Post: 87 %
FEV1-%Pred-Pre: 86 %
FEV1-Post: 1.96 L
FEV1-Pre: 1.95 L
FEV1FVC-%Change-Post: -1 %
FEV1FVC-%Pred-Pre: 117 %
FEV6-%Change-Post: 3 %
FEV6-%Pred-Post: 80 %
FEV6-%Pred-Pre: 77 %
FEV6-Post: 2.4 L
FEV6-Pre: 2.32 L
FEV6FVC-%Change-Post: 1 %
FEV6FVC-%Pred-Post: 108 %
FEV6FVC-%Pred-Pre: 107 %
FVC-%Change-Post: 2 %
FVC-%Pred-Post: 74 %
FVC-%Pred-Pre: 72 %
FVC-Post: 2.4 L
FVC-Pre: 2.35 L
Post FEV1/FVC ratio: 82 %
Post FEV6/FVC ratio: 100 %
Pre FEV1/FVC ratio: 83 %
Pre FEV6/FVC Ratio: 99 %
RV % pred: 77 %
RV: 1.93 L
TLC % pred: 72 %
TLC: 4.53 L

## 2024-06-08 MED ORDER — IPRATROPIUM BROMIDE 0.03 % NA SOLN: 2.0000 | Freq: Two times a day (BID) | NASAL | 12 refills | Status: AC

## 2024-06-08 NOTE — Patient Instructions (Addendum)
 Continue stiolto inhaler 2 puffs daily  Continue famotidine  20mg  daily for reflux  Start ipratropium nasal spray for post nasal drip, monitor for improvement in your cough  We will check a follow up CT Chest scan in November  Follow up in 3 months after CT Chest scan

## 2024-06-08 NOTE — Progress Notes (Signed)
 Full PFT performed today.

## 2024-06-08 NOTE — Patient Instructions (Signed)
 Full PFT performed today.

## 2024-06-08 NOTE — Progress Notes (Signed)
 Synopsis: Referred in May 2025 for Cough  Subjective:   PATIENT ID: Frederick Cisneros. GENDER: male DOB: September 24, 1941, MRN: [id number]  HPI  No chief complaint on file.  Frederick Cisneros is an 83 year old male, former smoker with CAD, GERD, hiatal hernia, OSA and DMII who returns to pulmonary clinic for cough.   OV visit 03/01/24 He experiences a chronic cough with heavy, clear mucus and a sensation of something stuck in his throat. There is no wheezing or hemoptysis, but mild exertional dyspnea is present.  He has a significant smoking history, having quit in 1990 after smoking one to one and a half packs per day for 20 to 30 years. There was a past suspicion of acid reflux, though heartburn is not currently severe.  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 He denies any history of pneumonia and does not have pets at home.  OV 06/08/24 He has a persistent cough that is most prominent in the morning with heavy mucus production, resolving after about fifteen minutes. There is no nocturnal cough, and he sleeps well. The cough does not significantly impact daily activities once the morning mucus is cleared.  He uses the Stiolto inhaler as needed and a nasal spray, though the name is unknown. He is prescribed Pepcid  for reflux, which occurs sometimes twice a day, particularly after naps or upon waking. Sinus and nasal drainage may contribute to his cough.  Pulmonary function tests show a forced vital capacity of 74% and a total lung capacity of 72%. Diffusion capacity is at 76%.    Past Medical History:  Diagnosis Date   Arthritis    BPH (benign prostatic hyperplasia)    C. difficile diarrhea    CAD (coronary artery disease)    Diverticulitis    Dyslipidemia    Esophageal reflux    Glaucoma    per pt   Hiatal hernia    Hypertension    LVH (left ventricular hypertrophy)    Sleep apnea    Type II or unspecified type  diabetes mellitus without mention of complication, not stated as uncontrolled      Family History  Problem Relation Age of Onset   Breast cancer Sister    Diabetes Sister        x 3   GI problems Father        type unknown   Colon cancer Neg Hx    Esophageal cancer Neg Hx    Stomach cancer Neg Hx    Rectal cancer Neg Hx      Social History   Socioeconomic History   Marital status: Married    Spouse name: Not on file   Number of children: 4   Years of education: Not on file   Highest education level: Not on file  Occupational History   Occupation: retired  Tobacco Use   Smoking status: Former    Current packs/day: 0.00    Types: Cigarettes    Quit date: 10/19/1988    Years since quitting: 35.6   Smokeless tobacco: Never  Vaping Use   Vaping status: Never Used  Substance and Sexual Activity   Alcohol use: Yes    Comment: occasional wine   Drug use: No   Sexual activity: Not on file  Other Topics Concern   Not on file  Social History Narrative   Not on file   Social Drivers of Health   Financial Resource Strain: Not on  file  Food Insecurity: Not on file  Transportation Needs: Not on file  Physical Activity: Not on file  Stress: Not on file  Social Connections: Not on file  Intimate Partner Violence: Not on file     No Known Allergies   Outpatient Medications Prior to Visit  Medication Sig Dispense Refill   albuterol  (VENTOLIN  HFA) 108 (90 Base) MCG/ACT inhaler Inhale 2 puffs into the lungs every 6 (six) hours as needed for wheezing or shortness of breath. 8.5 g 6   amLODipine  (NORVASC ) 5 MG tablet Take 5 mg by mouth daily.      aspirin 325 MG tablet Take 325 mg by mouth daily.     colchicine 0.6 MG tablet Take 0.6 mg by mouth 2 (two) times daily as needed (gout).     dorzolamide -timolol  (COSOPT ) 22.3-6.8 MG/ML ophthalmic solution Place 1 drop into both eyes 2 (two) times daily.     famotidine  (PEPCID ) 20 MG tablet 1 tablet at bedtime as needed      gabapentin (NEURONTIN) 100 MG capsule Take 100 mg by mouth at bedtime.     irbesartan (AVAPRO) 300 MG tablet Take 300 mg by mouth daily.     ketoconazole (NIZORAL) 2 % cream Apply 1 application topically daily as needed for irritation (itching). Reported on 11/05/2015     metoprolol  succinate (TOPROL -XL) 100 MG 24 hr tablet Take 100 mg by mouth daily. Take with or immediately following a meal.     Multiple Vitamin (MULTIVITAMIN WITH MINERALS) TABS tablet Take 1 tablet by mouth daily.     rosuvastatin  (CRESTOR ) 40 MG tablet Take 40 mg by mouth daily. Reported on 11/05/2015     Tiotropium Bromide-Olodaterol (STIOLTO RESPIMAT ) 2.5-2.5 MCG/ACT AERS Inhale 2 puffs into the lungs daily. 4 g 5   Travoprost, BAK Free, (TRAVATAN) 0.004 % SOLN ophthalmic solution Place 1 drop into both eyes at bedtime.     No facility-administered medications prior to visit.    Review of Systems  Constitutional:  Negative for chills, fever, malaise/fatigue and weight loss.  HENT:  Negative for congestion, sinus pain and sore throat.   Eyes: Negative.   Respiratory:  Positive for cough and sputum production. Negative for hemoptysis, shortness of breath and wheezing.   Cardiovascular:  Negative for chest pain, palpitations, orthopnea, claudication and leg swelling.  Gastrointestinal:  Negative for abdominal pain, heartburn, nausea and vomiting.  Genitourinary: Negative.   Musculoskeletal:  Positive for joint pain. Negative for myalgias.  Skin:  Negative for rash.  Neurological:  Negative for weakness.  Endo/Heme/Allergies: Negative.   Psychiatric/Behavioral: Negative.      Objective:   Vitals:   06/08/24 1511  BP: (!) 120/58  Pulse: 60  SpO2: 96%  Weight: 171 lb (77.6 kg)  Height: 5' 6 (1.676 m)      Physical Exam Constitutional:      General: He is not in acute distress.    Appearance: Normal appearance.  Eyes:     General: No scleral icterus.    Conjunctiva/sclera: Conjunctivae normal.   Cardiovascular:     Rate and Rhythm: Normal rate and regular rhythm.  Pulmonary:     Breath sounds: No wheezing, rhonchi or rales.  Musculoskeletal:     Right lower leg: No edema.     Left lower leg: No edema.  Skin:    General: Skin is warm and dry.  Neurological:     General: No focal deficit present.     CBC    Component Value Date/Time  WBC 7.3 05/08/2022 1404   RBC 4.33 05/08/2022 1404   HGB 13.1 05/08/2022 1404   HCT 39.6 05/08/2022 1404   PLT 190 05/08/2022 1404   MCV 91.5 05/08/2022 1404   MCH 30.3 05/08/2022 1404   MCHC 33.1 05/08/2022 1404   RDW 14.0 05/08/2022 1404   LYMPHSABS 1.9 05/08/2022 1404   MONOABS 1.3 (H) 05/08/2022 1404   EOSABS 0.2 05/08/2022 1404   BASOSABS 0.0 05/08/2022 1404      Latest Ref Rng & Units 05/08/2022    2:04 PM 04/12/2022    1:17 PM 03/28/2022    6:10 AM  BMP  Glucose 70 - 99 mg/dL 884  823  882   BUN 8 - 23 mg/dL 21  23  13    Creatinine 0.61 - 1.24 mg/dL 8.21  8.32  8.74   Sodium 135 - 145 mmol/L 136  136  142   Potassium 3.5 - 5.1 mmol/L 3.8  4.0  3.4   Chloride 98 - 111 mmol/L 105  106  112   CO2 22 - 32 mmol/L 23  21  24    Calcium  8.9 - 10.3 mg/dL 9.3  9.7  8.8    Chest imaging: CT Chest 08/20/23 Mediastinum/Nodes: No pathologically enlarged mediastinal or hilar lymph nodes. Please note that accurate exclusion of hilar adenopathy is limited on noncontrast CT scans. Esophagus is unremarkable in appearance.   Lungs/Pleura: 4 mm pulmonary nodule in the posterior aspect of the left upper lobe (axial image 46 of series 4), nonspecific. No other larger more suspicious appearing pulmonary nodules or masses are noted. No confluent consolidative airspace disease. No pleural effusions. However, there is mild ground-glass attenuation noted throughout the mid to lower lungs bilaterally, most evident in the lower lobes of the lungs. Mild cylindrical bronchiectasis is also evident in the lower lobes of the lungs bilaterally (right  greater than left). Mild centrilobular and paraseptal emphysema.  PFT:    Latest Ref Rng & Units 06/08/2024    1:44 PM  PFT Results  FVC-Pre L 2.35  P  FVC-Predicted Pre % 72  P  FVC-Post L 2.40  P  FVC-Predicted Post % 74  P  Pre FEV1/FVC % % 83  P  Post FEV1/FCV % % 82  P  FEV1-Pre L 1.95  P  FEV1-Predicted Pre % 86  P  FEV1-Post L 1.96  P  DLCO uncorrected ml/min/mmHg 16.21  P  DLCO UNC% % 76  P  DLCO corrected ml/min/mmHg 16.21  P  DLCO COR %Predicted % 76  P  DLVA Predicted % 118  P  TLC L 4.53  P  TLC % Predicted % 72  P  RV % Predicted % 77  P    P Preliminary result    Labs:  Path:  Echo:  Heart Catheterization:    Assessment & Plan:   Centrilobular emphysema (HCC)  Bronchiectasis without complication (HCC)  Interstitial lung abnormality (ILA) - Plan: CT CHEST HIGH RESOLUTION  Post-nasal drip - Plan: ipratropium (ATROVENT ) 0.03 % nasal spray  Discussion: Isahi Godwin is an 83 year old male, former smoker with CAD, GERD, hiatal hernia, OSA and DMII who is referred to pulmonary clinic for cough.   Emphysema Mild Bronchiectasis Possible ILD - Continue stiolto inhaler 2 puffs daily - Use albuterol  inhale 1-2 puffs every 4-6 hours as needed - Schedule follow-up CT chest scan in November.  Chronic cough Chronic cough likely multifactorial, related to bronchiectasis, possible ILD, possible sinus drainage, and potential  reflux. - continue famotidine  daily - start ipratropium nasasl spray  Follow up in 3 months after CT Chest scan  Dorn Chill, MD [facility] Office: [phone]    Current Outpatient Medications:    albuterol  (VENTOLIN  HFA) 108 (90 Base) MCG/ACT inhaler, Inhale 2 puffs into the lungs every 6 (six) hours as needed for wheezing or shortness of breath., Disp: 8.5 g, Rfl: 6   amLODipine  (NORVASC ) 5 MG tablet, Take 5 mg by mouth daily. , Disp: , Rfl:    aspirin 325 MG tablet, Take 325 mg by mouth  daily., Disp: , Rfl:    colchicine 0.6 MG tablet, Take 0.6 mg by mouth 2 (two) times daily as needed (gout)., Disp: , Rfl:    dorzolamide -timolol  (COSOPT ) 22.3-6.8 MG/ML ophthalmic solution, Place 1 drop into both eyes 2 (two) times daily., Disp: , Rfl:    famotidine  (PEPCID ) 20 MG tablet, 1 tablet at bedtime as needed, Disp: , Rfl:    gabapentin (NEURONTIN) 100 MG capsule, Take 100 mg by mouth at bedtime., Disp: , Rfl:    ipratropium (ATROVENT ) 0.03 % nasal spray, Place 2 sprays into both nostrils every 12 (twelve) hours., Disp: 30 mL, Rfl: 12   irbesartan (AVAPRO) 300 MG tablet, Take 300 mg by mouth daily., Disp: , Rfl:    ketoconazole (NIZORAL) 2 % cream, Apply 1 application topically daily as needed for irritation (itching). Reported on 11/05/2015, Disp: , Rfl:    metoprolol  succinate (TOPROL -XL) 100 MG 24 hr tablet, Take 100 mg by mouth daily. Take with or immediately following a meal., Disp: , Rfl:    Multiple Vitamin (MULTIVITAMIN WITH MINERALS) TABS tablet, Take 1 tablet by mouth daily., Disp: , Rfl:    rosuvastatin  (CRESTOR ) 40 MG tablet, Take 40 mg by mouth daily. Reported on 11/05/2015, Disp: , Rfl:    Tiotropium Bromide-Olodaterol (STIOLTO RESPIMAT ) 2.5-2.5 MCG/ACT AERS, Inhale 2 puffs into the lungs daily., Disp: 4 g, Rfl: 5   Travoprost, BAK Free, (TRAVATAN) 0.004 % SOLN ophthalmic solution, Place 1 drop into both eyes at bedtime., Disp: , Rfl:

## 2024-06-09 DIAGNOSIS — H905 Unspecified sensorineural hearing loss: Secondary | ICD-10-CM | POA: Diagnosis not present

## 2024-06-09 DIAGNOSIS — H9311 Tinnitus, right ear: Secondary | ICD-10-CM | POA: Diagnosis not present

## 2024-06-12 ENCOUNTER — Encounter (INDEPENDENT_AMBULATORY_CARE_PROVIDER_SITE_OTHER): Payer: Self-pay

## 2024-06-13 ENCOUNTER — Other Ambulatory Visit: Payer: Self-pay | Admitting: Family Medicine

## 2024-06-13 DIAGNOSIS — M79605 Pain in left leg: Secondary | ICD-10-CM

## 2024-06-13 DIAGNOSIS — I739 Peripheral vascular disease, unspecified: Secondary | ICD-10-CM

## 2024-06-18 DIAGNOSIS — N1832 Chronic kidney disease, stage 3b: Secondary | ICD-10-CM | POA: Diagnosis not present

## 2024-06-18 DIAGNOSIS — E7800 Pure hypercholesterolemia, unspecified: Secondary | ICD-10-CM | POA: Diagnosis not present

## 2024-06-18 DIAGNOSIS — E1122 Type 2 diabetes mellitus with diabetic chronic kidney disease: Secondary | ICD-10-CM | POA: Diagnosis not present

## 2024-06-18 DIAGNOSIS — I10 Essential (primary) hypertension: Secondary | ICD-10-CM | POA: Diagnosis not present

## 2024-06-21 ENCOUNTER — Institutional Professional Consult (permissible substitution) (INDEPENDENT_AMBULATORY_CARE_PROVIDER_SITE_OTHER): Admitting: Physician Assistant

## 2024-07-12 ENCOUNTER — Ambulatory Visit
Admission: RE | Admit: 2024-07-12 | Discharge: 2024-07-12 | Disposition: A | Discharge status: Home / Self Care | Source: Ambulatory Visit | Attending: Family Medicine

## 2024-07-12 DIAGNOSIS — M79605 Pain in left leg: Secondary | ICD-10-CM

## 2024-07-12 DIAGNOSIS — I739 Peripheral vascular disease, unspecified: Secondary | ICD-10-CM

## 2024-07-12 HISTORY — PX: IR RADIOLOGIST EVAL & MGMT: IMG5224

## 2024-07-12 NOTE — Progress Notes (Signed)
 Chief Complaint: Patient was seen today for left foot burning  Referring Physician(s): Tu,Ching T    Patient Status: [facility] --Outpatient  Subjective: Frederick Cisneros is an 83yo male who complains of left foot burning.  Underlying DM.  No recent HgbA1C in chart.  His pain is along the top of his left foot and is always present.  Occasionally the pain worsens  to 6/10, but is usually 1-2/10.  He also complains of the pain extending into posterior aspect of his calf. He thinks his foot has been swelling but is unsure states that it just feels like skin tight. 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 No evidence of claudication or true rest. Given the atherosclerosis identified on most recent CT of the abdomen and pelvis as well as fact I would like to rule out component of arterial disease contributing to his symptoms. We will start with an ABI exam and have the patient follow-up clinically. The patient has recently been started on gabapentin and likely increase in his dosage for symptomatic relief what I believe is neuropathy secondary to diabetes mellitus. Although when asked, the patient states he is not taking gabapentin.   Objective: Physical Exam: BP (!) 171/68 (BP Location: Left Arm, Patient Position: Sitting, Cuff Size: Normal)   Pulse 61   Temp 97.7 F (36.5 C) (Oral)   Resp 20   SpO2 95%     Current Outpatient Medications:    albuterol  (VENTOLIN  HFA) 108 (90 Base) MCG/ACT inhaler, Inhale 2 puffs into the lungs every 6 (six) hours as needed for wheezing or shortness of breath., Disp: 8.5 g, Rfl: 6   amLODipine  (NORVASC ) 5 MG tablet, Take 5 mg by mouth daily. , Disp: , Rfl:    aspirin 325 MG tablet, Take 325 mg by mouth daily., Disp: , Rfl:    colchicine 0.6 MG tablet, Take 0.6 mg by mouth 2 (two) times daily as needed (gout)., Disp: , Rfl:    dorzolamide -timolol  (COSOPT ) 22.3-6.8 MG/ML ophthalmic solution, Place 1 drop into both eyes  2 (two) times daily., Disp: , Rfl:    famotidine  (PEPCID ) 20 MG tablet, 1 tablet at bedtime as needed, Disp: , Rfl:    gabapentin (NEURONTIN) 100 MG capsule, Take 100 mg by mouth at bedtime., Disp: , Rfl:    ipratropium (ATROVENT ) 0.03 % nasal spray, Place 2 sprays into both nostrils every 12 (twelve) hours., Disp: 30 mL, Rfl: 12   irbesartan (AVAPRO) 300 MG tablet, Take 300 mg by mouth daily., Disp: , Rfl:    ketoconazole (NIZORAL) 2 % cream, Apply 1 application topically daily as needed for irritation (itching). Reported on 11/05/2015, Disp: , Rfl:    metoprolol  succinate (TOPROL -XL) 100 MG 24 hr tablet, Take 100 mg by mouth daily. Take with or immediately following a meal., Disp: , Rfl:    Multiple Vitamin (MULTIVITAMIN WITH MINERALS) TABS tablet, Take 1 tablet by mouth daily., Disp: , Rfl:    rosuvastatin  (CRESTOR ) 40 MG tablet, Take 40 mg by mouth daily. Reported on 11/05/2015, Disp: , Rfl:    Tiotropium Bromide-Olodaterol (STIOLTO RESPIMAT ) 2.5-2.5 MCG/ACT AERS, Inhale 2 puffs into the lungs daily., Disp: 4 g, Rfl: 5   Travoprost, BAK Free, (TRAVATAN) 0.004 % SOLN ophthalmic solution, Place 1 drop into both eyes at bedtime., Disp: , Rfl:   Labs: CBC No results for input(s): WBC, HGB, HCT, PLT in the last 72 hours. BMET No results for input(s): NA,  K, CL, CO2, GLUCOSE, BUN, CREATININE, CALCIUM  in the last 72 hours. LFT No results for input(s): PROT, ALBUMIN, AST, ALT, ALKPHOS, BILITOT, BILIDIR, IBILI, LIPASE in the last 72 hours. PT/INR No results for input(s): LABPROT, INR in the last 72 hours.   Studies/Results: No results found.  Assessment/Plan: Left foot pain most consistent with neuropathic pain.  Will obtain Bilateral ABIs and follow up with a clinic visit as soon as possible.  Likely needs to have his gabapentin dosage increased for symptomatic relief.    LOS: 0 days   I spent a total of 20 minutes in face to face in clinical  consultation, greater than 50% of which was counseling/coordinating care for left foot pain.  Cordella DELENA Banner PA-C 07/12/2024 1:04 PM

## 2024-07-13 ENCOUNTER — Other Ambulatory Visit: Payer: Self-pay

## 2024-07-13 DIAGNOSIS — M792 Neuralgia and neuritis, unspecified: Secondary | ICD-10-CM

## 2024-07-13 DIAGNOSIS — R208 Other disturbances of skin sensation: Secondary | ICD-10-CM

## 2024-07-18 DIAGNOSIS — I10 Essential (primary) hypertension: Secondary | ICD-10-CM | POA: Diagnosis not present

## 2024-07-18 DIAGNOSIS — N1832 Chronic kidney disease, stage 3b: Secondary | ICD-10-CM | POA: Diagnosis not present

## 2024-07-18 DIAGNOSIS — E1122 Type 2 diabetes mellitus with diabetic chronic kidney disease: Secondary | ICD-10-CM | POA: Diagnosis not present

## 2024-07-18 DIAGNOSIS — E7800 Pure hypercholesterolemia, unspecified: Secondary | ICD-10-CM | POA: Diagnosis not present

## 2024-07-19 ENCOUNTER — Ambulatory Visit
Admission: RE | Admit: 2024-07-19 | Discharge: 2024-07-19 | Disposition: A | Discharge status: Home / Self Care | Source: Ambulatory Visit

## 2024-07-19 DIAGNOSIS — R208 Other disturbances of skin sensation: Secondary | ICD-10-CM

## 2024-07-19 DIAGNOSIS — I10 Essential (primary) hypertension: Secondary | ICD-10-CM | POA: Diagnosis not present

## 2024-07-19 DIAGNOSIS — M792 Neuralgia and neuritis, unspecified: Secondary | ICD-10-CM

## 2024-07-26 ENCOUNTER — Ambulatory Visit: Admission: RE | Admit: 2024-07-26 | Discharge: 2024-07-26 | Disposition: A | Source: Ambulatory Visit

## 2024-07-26 DIAGNOSIS — M792 Neuralgia and neuritis, unspecified: Secondary | ICD-10-CM

## 2024-07-26 DIAGNOSIS — I10 Essential (primary) hypertension: Secondary | ICD-10-CM | POA: Diagnosis not present

## 2024-07-26 DIAGNOSIS — G4733 Obstructive sleep apnea (adult) (pediatric): Secondary | ICD-10-CM | POA: Diagnosis not present

## 2024-07-26 DIAGNOSIS — R208 Other disturbances of skin sensation: Secondary | ICD-10-CM

## 2024-08-10 NOTE — Progress Notes (Signed)
 Referring Physician(s): Kacia Halley A  Patient Status:  [facility] outpatient  Chief Complaint:  F/U vascular testing bilateral lower extremities.  Subjective:  Frederick Cisneros is an 83 yo male with PAD and previously treated arterial disease.  He is also has DM2.  His initial complaint was burning in the feet L>R and this is most suspicious for diabetic neuropathy.  He continues to exercise and go the the gym weekly.  ABIs were obtained to rule out worsening arterial disease as the patient is not the best historian and has some difficulty describing his symptoms.  He was also not sure he was in fact taking neurontin.  His ABIs have not significantly changed and I feel his symptoms are mostly from neuropathy and not claudication.  I would recommend he continue with neurontin and titrating the dose to see if he gets any interval benefit.  Allergies: Patient has no known allergies.  Medications: Prior to Admission medications   Medication Sig Start Date End Date Taking? Authorizing Provider  albuterol  (VENTOLIN  HFA) 108 (90 Base) MCG/ACT inhaler Inhale 2 puffs into the lungs every 6 (six) hours as needed for wheezing or shortness of breath. 03/01/24   Kara Dorn NOVAK, MD  amLODipine  (NORVASC ) 5 MG tablet Take 5 mg by mouth daily.     [provider]  aspirin 325 MG tablet Take 325 mg by mouth daily.    [provider]  colchicine 0.6 MG tablet Take 0.6 mg by mouth 2 (two) times daily as needed (gout). 09/29/19   [provider]  dorzolamide -timolol  (COSOPT ) 22.3-6.8 MG/ML ophthalmic solution Place 1 drop into both eyes 2 (two) times daily. 08/22/19   [provider]  famotidine  (PEPCID ) 20 MG tablet 1 tablet at bedtime as needed 01/22/22   [provider]  gabapentin (NEURONTIN) 100 MG capsule Take 100 mg by mouth at bedtime. 06/01/24   [provider]  ipratropium (ATROVENT ) 0.03 % nasal spray Place 2 sprays into both  nostrils every 12 (twelve) hours. 06/08/24   Kara Dorn NOVAK, MD  irbesartan (AVAPRO) 300 MG tablet Take 300 mg by mouth daily. 02/16/24   [provider]  ketoconazole (NIZORAL) 2 % cream Apply 1 application topically daily as needed for irritation (itching). Reported on 11/05/2015 08/08/13   [provider]  metoprolol  succinate (TOPROL -XL) 100 MG 24 hr tablet Take 100 mg by mouth daily. Take with or immediately following a meal.    [provider]  Multiple Vitamin (MULTIVITAMIN WITH MINERALS) TABS tablet Take 1 tablet by mouth daily.    [provider]  rosuvastatin  (CRESTOR ) 40 MG tablet Take 40 mg by mouth daily. Reported on 11/05/2015    [provider]  Tiotropium Bromide-Olodaterol (STIOLTO RESPIMAT ) 2.5-2.5 MCG/ACT AERS Inhale 2 puffs into the lungs daily. 03/01/24   Kara Dorn NOVAK, MD  Travoprost, BAK Free, (TRAVATAN) 0.004 % SOLN ophthalmic solution Place 1 drop into both eyes at bedtime. 08/22/19   [provider]     Vital Signs: There were no vitals taken for this visit.  Physical Exam  Imaging: No results found.  Labs:  CBC: No results for input(s): WBC, HGB, HCT, PLT in the last 8760 hours.  COAGS: No results for input(s): INR, APTT in the last 8760 hours.  BMP: No results for input(s): NA, K, CL, CO2, GLUCOSE, BUN, CALCIUM , CREATININE, GFRNONAA, GFRAA in the last 8760 hours.  Invalid input(s): CMP  LIVER FUNCTION TESTS: No results for input(s): BILITOT, AST,  ALT, ALKPHOS, PROT, ALBUMIN in the last 8760 hours.  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  Continue titrating neurontin for potential benefit.  No planned intervention.  Continue with yearly follow up for PAD (sooner if wounds or true claudication returns).  Electronically Signed: Cordella DELENA Banner, MD 08/10/2024, 1:39 PM   I spent a total of 15 Minutes at the the  patient's bedside AND on the patient's hospital floor or unit, greater than 50% of which was counseling/coordinating care for PAD and peripheral neuropathy.

## 2024-08-17 NOTE — Addendum Note (Signed)
 Encounter addended by: Jaicion Laurie K, RT on: 08/17/2024 9:46 AM  Actions taken: Imaging Exam ended

## 2024-08-21 ENCOUNTER — Encounter (INDEPENDENT_AMBULATORY_CARE_PROVIDER_SITE_OTHER): Payer: Self-pay | Admitting: Physician Assistant

## 2024-08-21 ENCOUNTER — Ambulatory Visit (INDEPENDENT_AMBULATORY_CARE_PROVIDER_SITE_OTHER): Admitting: Audiology

## 2024-08-21 ENCOUNTER — Ambulatory Visit (INDEPENDENT_AMBULATORY_CARE_PROVIDER_SITE_OTHER): Admitting: Physician Assistant

## 2024-08-21 ENCOUNTER — Other Ambulatory Visit (INDEPENDENT_AMBULATORY_CARE_PROVIDER_SITE_OTHER): Payer: Self-pay | Admitting: Physician Assistant

## 2024-08-21 VITALS — BP 184/68 | HR 60 | Ht 65.5 in | Wt 172.0 lb

## 2024-08-21 DIAGNOSIS — H9313 Tinnitus, bilateral: Secondary | ICD-10-CM

## 2024-08-21 DIAGNOSIS — H903 Sensorineural hearing loss, bilateral: Secondary | ICD-10-CM | POA: Diagnosis not present

## 2024-08-21 NOTE — Progress Notes (Unsigned)
 Patient is having BP managed. Just had medication added for BP on Friday 10/31.

## 2024-08-21 NOTE — Patient Instructions (Signed)
 I have ordered an imaging study for you to complete prior to your next visit. Please call [facility] Scheduling at [phone] to schedule your imaging if you have not received a call within 24 hours. If you are unable to complete your imaging study prior to your next scheduled visit please call our office to let us  know.

## 2024-08-21 NOTE — Progress Notes (Unsigned)
 Dear Dr. Arloa, Here is my assessment for our mutual patient, Frederick Cisneros. Thank you for allowing me the opportunity to care for your patient. Please do not hesitate to contact me should you have any other questions. Sincerely, Chyrl Cohen PA-C  Otolaryngology Clinic Note Referring provider: Dr. Arloa HPI:  Frederick Cisneros is a 83 y.o. male kindly referred by Dr. Arloa   The patient is an 83 year old gentleman present today for evaluation of tinnitus.  Patient notes approximate 3 years ago he started developing left-sided tonal tinnitus.  He denies any associated pain, no decreased hearing, no drainage, no history of ear related surgeries or trauma, no history recurrent ear infections.  He reports that over the last several months he has had a sizzling ringing sound in his right ear, no pulsatile characteristics, again no associated pain or concerning symptoms.  He also notes some pain at the left TMJ, point tenderness.  No dizziness.    Independent Review of Additional Tests or Records:  Audiologcal evaluation 08/21/2024     Otoscopy: Right ear: Clear external ear canal and notable landmarks visualized on the tympanic membrane. Left ear:  Clear external ear canal and notable landmarks visualized on the tympanic membrane.   Tympanometry: Right ear: Normal external ear canal volume with normal middle ear pressure and tympanic membrane compliance (Type A). Findings are suggestive of normal middle ear function. Left ear: Normal external ear canal volume with normal middle ear pressure and tympanic membrane compliance (Type A). Findings are suggestive of normal middle ear function.     Hearing Evaluation The hearing test results were completed under headphones and results are deemed to be of good reliability. Test technique:  conventional     Pure tone Audiometry: Right ear- Normal to moderately severe sensorineural hearing loss from 125 Hz - 8000 Hz. Left ear-  Normal to moderately  severe sensorineural hearing loss from 125 Hz - 8000 Hz.   Speech Audiometry: Right ear- Speech Reception Threshold (SRT) was obtained at 35 dBHL. Left ear-Speech Reception Threshold (SRT) was obtained at 20 dBHL.   Word Recognition Score Tested using NU-6 (recorded) Right ear: 84% was obtained at a presentation level of 75 dBHL with contralateral masking which is deemed as  good . Left ear: 96% was obtained at a presentation level of 75 dBHL with contralateral masking which is deemed as  excellent.   Impression: There is not a significant difference in pure-tone thresholds between ears, worse in the right ear from 475 362 1546 Hz.   Recommendations: Follow up with ENT as scheduled for today. Return for a hearing evaluation if concerns with hearing changes arise or per MD recommendation. Consider a communication needs assessment after medical clearance for hearing aids is obtained.    PMH/Meds/All/SocHx/FamHx/ROS:   Past Medical History:  Diagnosis Date   Arthritis    BPH (benign prostatic hyperplasia)    C. difficile diarrhea    CAD (coronary artery disease)    Diverticulitis    Dyslipidemia    Esophageal reflux    Glaucoma    per pt   Hiatal hernia    Hypertension    LVH (left ventricular hypertrophy)    Sleep apnea    Type II or unspecified type diabetes mellitus without mention of complication, not stated as uncontrolled      Past Surgical History:  Procedure Laterality Date   CARDIAC CATHETERIZATION     CAROTID ENDARTERECTOMY     COLONOSCOPY     IR GENERIC HISTORICAL  04/29/2016  IR RADIOLOGIST EVAL & MGMT 04/29/2016 Ami Bellman, DO [facility] INTERV RAD   IR RADIOLOGIST EVAL & MGMT  12/22/2016   IR RADIOLOGIST EVAL & MGMT  12/23/2017   IR RADIOLOGIST EVAL & MGMT  12/14/2018   IR RADIOLOGIST EVAL & MGMT  12/28/2019   IR RADIOLOGIST EVAL & MGMT  01/15/2021   IR RADIOLOGIST EVAL & MGMT  12/29/2021   IR RADIOLOGIST EVAL & MGMT  07/12/2024   ROTATOR CUFF REPAIR     right    STOMACH SURGERY     TRANSURETHRAL RESECTION OF PROSTATE      Family History  Problem Relation Age of Onset   Breast cancer Sister    Diabetes Sister        x 3   GI problems Father        type unknown   Colon cancer Neg Hx    Esophageal cancer Neg Hx    Stomach cancer Neg Hx    Rectal cancer Neg Hx      Social Connections: Not on file      Current Outpatient Medications:    albuterol  (VENTOLIN  HFA) 108 (90 Base) MCG/ACT inhaler, Inhale 2 puffs into the lungs every 6 (six) hours as needed for wheezing or shortness of breath., Disp: 8.5 g, Rfl: 6   amLODipine  (NORVASC ) 5 MG tablet, Take 5 mg by mouth daily. , Disp: , Rfl:    aspirin 325 MG tablet, Take 325 mg by mouth daily., Disp: , Rfl:    chlorthalidone (HYGROTON) 25 MG tablet, Take 25 mg by mouth daily., Disp: , Rfl:    colchicine 0.6 MG tablet, Take 0.6 mg by mouth 2 (two) times daily as needed (gout)., Disp: , Rfl:    dorzolamide -timolol  (COSOPT ) 22.3-6.8 MG/ML ophthalmic solution, Place 1 drop into both eyes 2 (two) times daily., Disp: , Rfl:    famotidine  (PEPCID ) 20 MG tablet, 1 tablet at bedtime as needed, Disp: , Rfl:    gabapentin (NEURONTIN) 100 MG capsule, Take 100 mg by mouth at bedtime., Disp: , Rfl:    ipratropium (ATROVENT ) 0.03 % nasal spray, Place 2 sprays into both nostrils every 12 (twelve) hours., Disp: 30 mL, Rfl: 12   irbesartan (AVAPRO) 300 MG tablet, Take 300 mg by mouth daily., Disp: , Rfl:    ketoconazole (NIZORAL) 2 % cream, Apply 1 application topically daily as needed for irritation (itching). Reported on 11/05/2015, Disp: , Rfl:    metoprolol  succinate (TOPROL -XL) 100 MG 24 hr tablet, Take 100 mg by mouth daily. Take with or immediately following a meal., Disp: , Rfl:    Multiple Vitamin (MULTIVITAMIN WITH MINERALS) TABS tablet, Take 1 tablet by mouth daily., Disp: , Rfl:    rosuvastatin  (CRESTOR ) 40 MG tablet, Take 40 mg by mouth daily. Reported on 11/05/2015, Disp: , Rfl:    Tiotropium  Bromide-Olodaterol (STIOLTO RESPIMAT ) 2.5-2.5 MCG/ACT AERS, Inhale 2 puffs into the lungs daily., Disp: 4 g, Rfl: 5   Travoprost, BAK Free, (TRAVATAN) 0.004 % SOLN ophthalmic solution, Place 1 drop into both eyes at bedtime., Disp: , Rfl:    Physical Exam:   BP (!) 184/68   Pulse 60   Ht 5' 5.5 (1.664 m)   Wt 172 lb (78 kg)   SpO2 96%   BMI 28.19 kg/m   Pertinent Findings  CN II-XII grossly intact Bilateral EAC clear and TM intact with well pneumatized middle ear spaces Anterior rhinoscopy: Septum midline; bilateral inferior turbinates with no hypertrophy  No lesions of oral  cavity/oropharynx; dentition numerous missing teeth  No obviously palpable neck masses/lymphadenopathy/thyromegaly; pain at left TMJ with palpation no crepitus  No respiratory distress or stridor   Seprately Identifiable Procedures:  None  Impression & Plans:  Frederick Cisneros is a 83 y.o. 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  I would recommend MRI IAC for further evaluation.  Once results were available I will call him.  Patient also was noted to have hypertension here today, this is longstanding, he will follow-up with his primary care provider for repeat evaluation and further management of this.   - f/u phone call discussion with MRI results, follow-up with primary care for ongoing uncontrolled hypertension.   Thank you for allowing me the opportunity to care for your patient. Please do not hesitate to contact me should you have any other questions.  Sincerely, Chyrl Cohen PA-C [facility] Phone: [phone] Fax: [phone]  08/21/2024, 3:12 PM

## 2024-08-21 NOTE — Progress Notes (Signed)
  [address] [place], [place] [address] [phone]  Audiological Evaluation    Name: Frederick Cisneros.     DOB:   03/01/1941      MRN:   [id number]                                                                                     Service Date: 08/21/2024     Accompanied by: unaccompanied   Patient comes today after [name], PA-C sent a referral for a hearing evaluation due to concerns with tinnitus.   Symptoms Yes Details  Hearing loss  [x]  Perceives right side is rose than left. Had a hearing test about 3 years ago and was told to have hearing loss, but does nto recall details. Reports difficulty hearing sometimes, for example : [employer] study  Tinnitus  [x]  Left for  5-6 years; in th last month started hearing a sizzling sound in the right ear.  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 Left ear:  Clear external ear canal and notable landmarks visualized on the tympanic membrane.  Tympanometry: Right ear: Normal external ear canal volume with normal middle ear pressure and tympanic membrane compliance (Type A). Findings are suggestive of normal middle ear function. Left ear: Normal external ear canal volume with normal middle ear pressure and tympanic membrane compliance (Type A). Findings are suggestive of normal middle ear function.   Hearing Evaluation The hearing test results were completed under headphones and results are deemed to be of good reliability. Test technique:  conventional    Pure tone Audiometry: Right ear- Normal to moderately severe sensorineural hearing loss from 125 Hz - 8000  Hz. Left ear-  Normal to moderately severe sensorineural hearing loss from 125 Hz - 8000 Hz.  Speech Audiometry: Right ear- Speech Reception Threshold (SRT) was obtained at 35 dBHL. Left ear-Speech Reception Threshold (SRT) was obtained at 20 dBHL.   Word Recognition Score Tested using NU-6 (recorded) Right ear: 84% was obtained at a presentation level of 75 dBHL with contralateral masking which is deemed as  good . Left ear: 96% was obtained at a presentation level of 75 dBHL with contralateral masking which is deemed as  excellent.   Impression: There is not a significant difference in pure-tone thresholds between ears, worse in the right ear from [id number] Hz.   Recommendations: Follow up with ENT as scheduled for today. Return for a hearing evaluation if concerns with hearing changes arise or per MD recommendation. Consider a communication needs assessment after medical clearance for hearing aids is obtained.   Landa Mullinax MARIE LEROUX-MARTINEZ, AUD

## 2024-08-28 ENCOUNTER — Ambulatory Visit
Admission: RE | Admit: 2024-08-28 | Discharge: 2024-08-28 | Disposition: A | Discharge status: Home / Self Care | Source: Ambulatory Visit | Attending: Pulmonary Disease | Admitting: Pulmonary Disease

## 2024-08-28 DIAGNOSIS — R918 Other nonspecific abnormal finding of lung field: Secondary | ICD-10-CM

## 2024-08-29 ENCOUNTER — Encounter: Payer: Self-pay | Admitting: Audiology

## 2024-09-25 ENCOUNTER — Encounter: Payer: Self-pay | Admitting: Pulmonary Disease

## 2024-09-25 ENCOUNTER — Ambulatory Visit: Admitting: Pulmonary Disease

## 2024-09-25 VITALS — BP 154/67 | HR 71 | Ht 65.5 in | Wt 175.4 lb

## 2024-09-25 DIAGNOSIS — J439 Emphysema, unspecified: Secondary | ICD-10-CM

## 2024-09-25 DIAGNOSIS — K219 Gastro-esophageal reflux disease without esophagitis: Secondary | ICD-10-CM

## 2024-09-25 DIAGNOSIS — R053 Chronic cough: Secondary | ICD-10-CM | POA: Diagnosis not present

## 2024-09-25 DIAGNOSIS — J432 Centrilobular emphysema: Secondary | ICD-10-CM

## 2024-09-25 DIAGNOSIS — J479 Bronchiectasis, uncomplicated: Secondary | ICD-10-CM

## 2024-09-25 NOTE — Patient Instructions (Addendum)
 Continue ipratropium nasal spray for post nasal drip  Continue famotidine  for reflux  Your CT chest scan was reviewed today, you have mild emphysema and bronchiectasis changes  Continue albuterol  inhaler 1-2 puffs every 4-6 hours as needed  Use flutter valve as needed to help clear chest congestion and bring out mucous - do 5-10 good puffs into the device and a deep cough may follow  Continue CPAP for sleep apnea  Follow up in 1 year, call sooner if needed

## 2024-09-25 NOTE — Progress Notes (Signed)
 Established Patient Pulmonology Office Visit   Subjective:  Patient ID: Frederick Kolar., male    DOB: November 15, 1940  MRN: [id number]  CC:  Chief Complaint  Patient presents with   Medical Management of Chronic Issues    Pt states no new concerns     Discussed the use of AI scribe software for clinical note transcription with the patient, who gave verbal consent to proceed.  History of Present Illness Frederick Cisneros. is an 83 year old male with emphysema who presents for follow-up.  He notes improved cough and breathing since the last visit. He uses albuterol  as needed but could not obtain stiolto inhaler due to cost. He has intermittent episodes of coughing up thick, nonpurulent mucus. The cough is significantly better and no longer wakes him at night. He sometimes coughs when eating quickly and denies wheezing.  A CT chest was done on November 10th, 2025 which shows predominantly lower lobe bronchiectasis that appears mild and areas of paraseptal/centrilobular emphysema, mild.   He has coronary artery disease, GERD with hiatal hernia, obstructive sleep apnea, and type 2 diabetes. He is a former smoker. He uses CPAP and feels it may cause morning gas. He takes famotidine  for GERD, which has improved.  He denies wheezing. Cough does not wake him at night. He occasionally coughs when eating quickly but otherwise denies problems with meals. He notes postnasal drainage has slowed but is still intermittently present.        ROS    Current Outpatient Medications:    albuterol  (VENTOLIN  HFA) 108 (90 Base) MCG/ACT inhaler, Inhale 2 puffs into the lungs every 6 (six) hours as needed for wheezing or shortness of breath., Disp: 8.5 g, Rfl: 6   amLODipine  (NORVASC ) 5 MG tablet, Take 5 mg by mouth daily. , Disp: , Rfl:    aspirin 325 MG tablet, Take 325 mg by mouth daily., Disp: , Rfl:    chlorthalidone (HYGROTON) 25 MG tablet, Take 25 mg by mouth daily., Disp: , Rfl:    colchicine 0.6  MG tablet, Take 0.6 mg by mouth 2 (two) times daily as needed (gout)., Disp: , Rfl:    dorzolamide -timolol  (COSOPT ) 22.3-6.8 MG/ML ophthalmic solution, Place 1 drop into both eyes 2 (two) times daily., Disp: , Rfl:    famotidine  (PEPCID ) 20 MG tablet, 1 tablet at bedtime as needed, Disp: , Rfl:    gabapentin (NEURONTIN) 100 MG capsule, Take 100 mg by mouth at bedtime., Disp: , Rfl:    ipratropium (ATROVENT ) 0.03 % nasal spray, Place 2 sprays into both nostrils every 12 (twelve) hours., Disp: 30 mL, Rfl: 12   irbesartan (AVAPRO) 300 MG tablet, Take 300 mg by mouth daily., Disp: , Rfl:    ketoconazole (NIZORAL) 2 % cream, Apply 1 application topically daily as needed for irritation (itching). Reported on 11/05/2015, Disp: , Rfl:    metoprolol  succinate (TOPROL -XL) 100 MG 24 hr tablet, Take 100 mg by mouth daily. Take with or immediately following a meal., Disp: , Rfl:    Multiple Vitamin (MULTIVITAMIN WITH MINERALS) TABS tablet, Take 1 tablet by mouth daily., Disp: , Rfl:    rosuvastatin  (CRESTOR ) 40 MG tablet, Take 40 mg by mouth daily. Reported on 11/05/2015, Disp: , Rfl:    Travoprost, BAK Free, (TRAVATAN) 0.004 % SOLN ophthalmic solution, Place 1 drop into both eyes at bedtime., Disp: , Rfl:       Objective:  BP (!) 156/67   Pulse 71   Ht 5' 5.5 (1.664  m) Comment: per pt  Wt 175 lb 6.4 oz (79.6 kg)   SpO2 96%   BMI 28.74 kg/m     Physical Exam Constitutional:      General: He is not in acute distress.    Appearance: Normal appearance.  Eyes:     General: No scleral icterus.    Conjunctiva/sclera: Conjunctivae normal.  Cardiovascular:     Rate and Rhythm: Normal rate and regular rhythm.  Pulmonary:     Breath sounds: No wheezing, rhonchi or rales.  Musculoskeletal:     Right lower leg: No edema.     Left lower leg: No edema.  Skin:    General: Skin is warm and dry.  Neurological:     General: No focal deficit present.      Diagnostic Review:    CT Chest 08/28/24 1.  Mild basilar predominant cylindrical bronchiectasis and residual coarsened ground-glass are nonspecific and may be postinfectious/postinflammatory in etiology. Findings are suggestive of an alternative diagnosis (not UIP) per consensus guidelines: Diagnosis of Idiopathic Pulmonary Fibrosis: An Official ATS/ERS/JRS/ALAT Clinical Practice Guideline. Am JINNY [employer] Crit Care Med Vol 198, Iss 5, [phone], Jun 19 2017. 2. Tiny left renal stone. 3. Aortic atherosclerosis (ICD10-I70.0). Coronary artery calcification. 4.  Emphysema (ICD10-J43.9).    Assessment & Plan:   Assessment & Plan Centrilobular emphysema (HCC)     Bronchiectasis without complication (HCC)     Chronic cough      Assessment and Plan Assessment & Plan Emphysema with mild basilar predominant cylindrical bronchiectasis and chronic cough with mucus production Chronic cough with mucus production improved. No wheezing or nocturnal cough. Emphysema likely due to prior smoking. Monitoring for symptom progression. - Discontinued Stiolto inhaler prescription due to cost. Does not appear to need maintenance inhaler at this time. - Continue albuterol  inhaler as needed. - Prescribed flutter valve device for mucus clearance. - Instructed on use of flutter valve: two puffs of albuterol  followed by flutter valve treatment 15 minutes later.   Gastroesophageal reflux disease GERD symptoms improved. No nocturnal heartburn. Gas and belching possibly related to CPAP or diet. - Continue current GERD management with Motadine.      Return in about 1 year (around 09/25/2025) for f/u visit Dr. Kara.   Dorn KATHEE Kara, MD
